# Patient Record
Sex: Female | Born: 1950 | Race: White | Hispanic: No | Smoking: Former smoker
Health system: Southern US, Community
[De-identification: ages and names within clinical notes are randomized; demographics above are authoritative.]

## PROBLEM LIST (undated history)

## (undated) DIAGNOSIS — M1990 Unspecified osteoarthritis, unspecified site: Secondary | ICD-10-CM

## (undated) DIAGNOSIS — J439 Emphysema, unspecified: Secondary | ICD-10-CM

## (undated) DIAGNOSIS — T7840XA Allergy, unspecified, initial encounter: Secondary | ICD-10-CM

## (undated) DIAGNOSIS — I509 Heart failure, unspecified: Secondary | ICD-10-CM

## (undated) DIAGNOSIS — E785 Hyperlipidemia, unspecified: Secondary | ICD-10-CM

## (undated) DIAGNOSIS — I10 Essential (primary) hypertension: Secondary | ICD-10-CM

## (undated) HISTORY — DX: Unspecified osteoarthritis, unspecified site: M19.90

## (undated) HISTORY — DX: Emphysema, unspecified: J43.9

## (undated) HISTORY — DX: Essential (primary) hypertension: I10

## (undated) HISTORY — DX: Hyperlipidemia, unspecified: E78.5

## (undated) HISTORY — DX: Allergy, unspecified, initial encounter: T78.40XA

## (undated) HISTORY — PX: BREAST SURGERY: SHX581

## (undated) HISTORY — PX: CORONARY ARTERY BYPASS GRAFT: SHX141

## (undated) HISTORY — DX: Heart failure, unspecified: I50.9

---

## 2013-08-21 DIAGNOSIS — I10 Essential (primary) hypertension: Secondary | ICD-10-CM | POA: Insufficient documentation

## 2015-01-04 DIAGNOSIS — Z951 Presence of aortocoronary bypass graft: Secondary | ICD-10-CM | POA: Insufficient documentation

## 2015-01-04 DIAGNOSIS — E7800 Pure hypercholesterolemia, unspecified: Secondary | ICD-10-CM | POA: Insufficient documentation

## 2015-05-27 LAB — HEPATIC FUNCTION PANEL
ALT: 36 U/L — AB (ref 7–35)
AST: 28 U/L (ref 13–35)

## 2015-05-27 LAB — LIPID PANEL
CHOLESTEROL: 155 mg/dL (ref 0–200)
HDL: 44 mg/dL (ref 35–70)
LDL CALC: 85 mg/dL
TRIGLYCERIDES: 132 mg/dL (ref 40–160)

## 2015-05-27 LAB — HEMOGLOBIN A1C: Hemoglobin A1C: 6.5

## 2015-06-10 ENCOUNTER — Encounter: Payer: Self-pay | Admitting: Sports Medicine

## 2015-06-10 ENCOUNTER — Ambulatory Visit (INDEPENDENT_AMBULATORY_CARE_PROVIDER_SITE_OTHER): Payer: Medicare Other | Admitting: Sports Medicine

## 2015-06-10 VITALS — BP 129/52 | HR 72 | Ht 59.0 in | Wt 156.0 lb

## 2015-06-10 DIAGNOSIS — Q828 Other specified congenital malformations of skin: Secondary | ICD-10-CM

## 2015-06-10 DIAGNOSIS — E785 Hyperlipidemia, unspecified: Secondary | ICD-10-CM | POA: Diagnosis not present

## 2015-06-10 DIAGNOSIS — Z0000 Encounter for general adult medical examination without abnormal findings: Secondary | ICD-10-CM | POA: Diagnosis not present

## 2015-06-10 DIAGNOSIS — E669 Obesity, unspecified: Secondary | ICD-10-CM | POA: Insufficient documentation

## 2015-06-10 DIAGNOSIS — I10 Essential (primary) hypertension: Secondary | ICD-10-CM

## 2015-06-10 DIAGNOSIS — I25810 Atherosclerosis of coronary artery bypass graft(s) without angina pectoris: Secondary | ICD-10-CM | POA: Diagnosis not present

## 2015-06-10 DIAGNOSIS — Z23 Encounter for immunization: Secondary | ICD-10-CM | POA: Diagnosis not present

## 2015-06-10 DIAGNOSIS — I2510 Atherosclerotic heart disease of native coronary artery without angina pectoris: Secondary | ICD-10-CM | POA: Insufficient documentation

## 2015-06-10 HISTORY — DX: Encounter for general adult medical examination without abnormal findings: Z00.00

## 2015-06-10 MED ORDER — TOPIRAMATE 50 MG PO TABS: ORAL_TABLET | ORAL | Status: DC

## 2015-06-10 MED ORDER — DULAGLUTIDE 1.5 MG/0.5ML ~~LOC~~ SOAJ: SUBCUTANEOUS | Status: DC

## 2015-06-10 MED ORDER — ASPIRIN EC 81 MG PO TBEC: 81.0000 mg | DELAYED_RELEASE_TABLET | Freq: Every day | ORAL | Status: DC

## 2015-06-10 NOTE — Assessment & Plan Note (Signed)
May return for skin tag removal.

## 2015-06-10 NOTE — Assessment & Plan Note (Signed)
Stable, no further episodes of angina. Continue atorvastatin, lisinopril, carvedilol, 81 mg of aspirin.

## 2015-06-10 NOTE — Assessment & Plan Note (Signed)
Continue atorvastatin

## 2015-06-10 NOTE — Assessment & Plan Note (Signed)
Continue lisinopril and carvedilol ?

## 2015-06-10 NOTE — Progress Notes (Signed)
  Subjective:    CC: Establish care.   HPI:  Hypertension: Well controlled on lisinopril  Coronary artery disease: Post quadruple bypass, currently on aspirin and carvedilol.  Hyperlipidemia: Stable on atorvastatin  Obesity: Desires to start weight loss treatment.  Past medical history, Surgical history, Family history not pertinant except as noted below, Social history, Allergies, and medications have been entered into the medical record, reviewed, and no changes needed.   Review of Systems: No headache, visual changes, nausea, vomiting, diarrhea, constipation, dizziness, abdominal pain, skin rash, fevers, chills, night sweats, swollen lymph nodes, weight loss, chest pain, body aches, joint swelling, muscle aches, shortness of breath, mood changes, visual or auditory hallucinations.  Objective:    General: Well Developed, well nourished, and in no acute distress.  Neuro: Alert and oriented x3, extra-ocular muscles intact, sensation grossly intact.  HEENT: Normocephalic, atraumatic, pupils equal round reactive to light, neck supple, no masses, no lymphadenopathy, thyroid nonpalpable.  Skin: Warm and dry, no rashes noted.  Cardiac: Regular rate and rhythm, no murmurs rubs or gallops.  Respiratory: Clear to auscultation bilaterally. Not using accessory muscles, speaking in full sentences.  Abdominal: Soft, nontender, nondistended, positive bowel sounds, no masses, no organomegaly.  Musculoskeletal: Shoulder, elbow, wrist, hip, knee, ankle stable, and with full range of motion.  Impression and Recommendations:    The patient was counselled, risk factors were discussed, anticipatory guidance given.

## 2015-06-10 NOTE — Assessment & Plan Note (Signed)
Declines influenza vaccination, pneumococcal 13, TDap. Recent blood work has already been done. She is also due for colonoscopy, cervical cancer screening, and mammogram.

## 2015-06-10 NOTE — Assessment & Plan Note (Signed)
With her history of coronary artery disease we do need to avoid phentermine, starting Trulicity, Topamax. Return monthly for weight checks,  I did inform her that if Trulicity wasn't covered we would  have to try to do a few more medications in the same class.

## 2015-06-17 ENCOUNTER — Ambulatory Visit (INDEPENDENT_AMBULATORY_CARE_PROVIDER_SITE_OTHER): Payer: Medicare Other | Admitting: Sports Medicine

## 2015-06-17 ENCOUNTER — Encounter: Payer: Self-pay | Admitting: Sports Medicine

## 2015-06-17 VITALS — BP 133/67 | HR 67 | Ht 59.0 in | Wt 157.0 lb

## 2015-06-17 DIAGNOSIS — I25810 Atherosclerosis of coronary artery bypass graft(s) without angina pectoris: Secondary | ICD-10-CM

## 2015-06-17 DIAGNOSIS — E669 Obesity, unspecified: Secondary | ICD-10-CM

## 2015-06-17 DIAGNOSIS — I10 Essential (primary) hypertension: Secondary | ICD-10-CM | POA: Diagnosis not present

## 2015-06-17 DIAGNOSIS — Z0000 Encounter for general adult medical examination without abnormal findings: Secondary | ICD-10-CM

## 2015-06-17 DIAGNOSIS — E119 Type 2 diabetes mellitus without complications: Secondary | ICD-10-CM

## 2015-06-17 DIAGNOSIS — M810 Age-related osteoporosis without current pathological fracture: Secondary | ICD-10-CM

## 2015-06-17 DIAGNOSIS — E1169 Type 2 diabetes mellitus with other specified complication: Secondary | ICD-10-CM | POA: Insufficient documentation

## 2015-06-17 MED ORDER — TOPIRAMATE 50 MG PO TABS: ORAL_TABLET | ORAL | Status: DC

## 2015-06-17 MED ORDER — CANAGLIFLOZIN-METFORMIN HCL 150-1000 MG PO TABS: 1.0000 | ORAL_TABLET | Freq: Two times a day (BID) | ORAL | Status: DC

## 2015-06-17 NOTE — Assessment & Plan Note (Signed)
Well controlled, no changes 

## 2015-06-17 NOTE — Assessment & Plan Note (Signed)
Starting in [facility]

## 2015-06-17 NOTE — Progress Notes (Addendum)
  Subjective:    Tracey Marshall is a 64 y.o. female who presents for a welcome to Medicare exam.   Hypertension: Stable and controlled.  Diabetes mellitus type 2: New diagnosis, hemoglobin A1c is 6.0.  Obesity: Unable to get any of the GLP-1 agonists approved. Has not yet started Topamax.  Cardiac risk factors: advanced age (older than 24 for men, 86 for women), diabetes mellitus, dyslipidemia, hypertension, obesity (BMI >= 30 kg/m2), sedentary lifestyle and smoking/ tobacco exposure.  Activities of Daily Living  In your present state of health, do you have any difficulty performing the following activities?:  Preparing food and eating?: No Bathing yourself: No Getting dressed: No Using the toilet:No Moving around from place to place: No In the past year have you fallen or had a near fall?:No  Current exercise habits: The patient does not participate in regular exercise at present.   Dietary issues discussed: Yes   Depression Screen (Note: if answer to either of the following is "Yes", then a more complete depression screening is indicated)  Q1: Over the past two weeks, have you felt down, depressed or hopeless?no Q2: Over the past two weeks, have you felt little interest or pleasure in doing things? no   The following portions of the patient's history were reviewed and updated as appropriate: allergies, current medications, past family history, past medical history, past social history, past surgical history and problem list. Review of Systems A comprehensive review of systems was negative.    Objective:     Vision by Snellen chart: right eye:20/25, left eye:20/20 Blood pressure 133/67, pulse 67, height 4\' 11"  (1.499 m), weight 157 lb (71.215 kg). Body mass index is 31.69 kg/(m^2). General: Well Developed, well nourished, and in no acute distress.  Neuro: Alert and oriented x3, extra-ocular muscles intact, sensation grossly intact. Cranial nerves II through XII are intact,  motor, sensory, and coordinative functions are all intact. HEENT: Normocephalic, atraumatic, pupils equal round reactive to light, neck supple, no masses, no lymphadenopathy, thyroid nonpalpable. Oropharynx, nasopharynx, external ear canals are unremarkable. Skin: Warm and dry, no rashes noted.  Cardiac: Regular rate and rhythm, no murmurs rubs or gallops.  Respiratory: Clear to auscultation bilaterally. Not using accessory muscles, speaking in full sentences.  Abdominal: Soft, nontender, nondistended, positive bowel sounds, no masses, no organomegaly.  Musculoskeletal: Shoulder, elbow, wrist, hip, knee, ankle stable, and with full range of motion.  12-lead ECG reviewed and shows normal sinus rhythm, rate of 66 bpm. Normal axis. No Q waves.  Assessment:   Healthy female, please see the assessment and plan for further details below.     Plan:     During the course of the visit the patient was educated and counseled about appropriate screening and preventive services including:   Pneumococcal vaccine   Influenza vaccine  Td vaccine  Screening electrocardiogram  Screening mammography  Bone densitometry screening  Colorectal cancer screening  Diabetes screening  Nutrition counseling   Patient Instructions (the written plan) was given to the patient.

## 2015-06-17 NOTE — Assessment & Plan Note (Signed)
None of the GLP-1 agonists are affordable, she will do Topamax, I can send thisthis to  [facility]

## 2015-06-17 NOTE — Assessment & Plan Note (Signed)
Complete physical as above. Blood work looks good, she is unfortunately a diabetic with a hemoglobin A1c of 6.5.

## 2015-06-21 ENCOUNTER — Telehealth: Payer: Self-pay | Admitting: Sports Medicine

## 2015-06-21 DIAGNOSIS — E669 Obesity, unspecified: Principal | ICD-10-CM

## 2015-06-21 DIAGNOSIS — E1169 Type 2 diabetes mellitus with other specified complication: Secondary | ICD-10-CM

## 2015-06-21 MED ORDER — CANAGLIFLOZIN 300 MG PO TABS: 300.0000 mg | ORAL_TABLET | Freq: Every day | ORAL | Status: DC

## 2015-06-21 MED ORDER — METFORMIN HCL 1000 MG PO TABS: 1000.0000 mg | ORAL_TABLET | Freq: Two times a day (BID) | ORAL | Status: DC

## 2015-06-21 NOTE — Telephone Encounter (Signed)
There is no generic for it, if she can afford it then do it, if not we will split them apart and do 2 separate medications.

## 2015-06-21 NOTE — Telephone Encounter (Signed)
Done and sent to mail order, if she still cant afford it then just have her do the metformin alone and not the invokana.

## 2015-06-21 NOTE — Telephone Encounter (Signed)
Pt called and stated [facility] does not have a generic for Invokamet. Will route to PCP to see what other alternatives there are.

## 2015-06-21 NOTE — Telephone Encounter (Signed)
Please send split Rx's to pharmacy. Thank you.

## 2015-06-22 NOTE — Telephone Encounter (Signed)
Left information on Pt's voicemail. Callback information provided for any questions.

## 2015-06-22 NOTE — Telephone Encounter (Signed)
Pt returned clinic call, went over information in detail. Verbalized understanding.

## 2015-06-23 ENCOUNTER — Other Ambulatory Visit: Payer: Self-pay | Admitting: Sports Medicine

## 2015-07-08 NOTE — Addendum Note (Signed)
Addended by: Huel Cote on: 07/08/2015 04:46 PM   Modules accepted: Orders

## 2015-08-17 ENCOUNTER — Ambulatory Visit (INDEPENDENT_AMBULATORY_CARE_PROVIDER_SITE_OTHER): Payer: Medicare Other | Admitting: Sports Medicine

## 2015-08-17 VITALS — BP 111/54 | HR 53 | Temp 97.9°F | Resp 18 | Wt 146.7 lb

## 2015-08-17 DIAGNOSIS — Z0000 Encounter for general adult medical examination without abnormal findings: Secondary | ICD-10-CM | POA: Diagnosis not present

## 2015-08-17 DIAGNOSIS — E119 Type 2 diabetes mellitus without complications: Secondary | ICD-10-CM

## 2015-08-17 DIAGNOSIS — E669 Obesity, unspecified: Secondary | ICD-10-CM

## 2015-08-17 DIAGNOSIS — E785 Hyperlipidemia, unspecified: Secondary | ICD-10-CM | POA: Diagnosis not present

## 2015-08-17 DIAGNOSIS — I10 Essential (primary) hypertension: Secondary | ICD-10-CM | POA: Diagnosis not present

## 2015-08-17 DIAGNOSIS — E1169 Type 2 diabetes mellitus with other specified complication: Secondary | ICD-10-CM

## 2015-08-17 LAB — COMPREHENSIVE METABOLIC PANEL WITH GFR
AST: 23 U/L (ref 10–35)
Albumin: 4.2 g/dL (ref 3.6–5.1)
BUN: 21 mg/dL (ref 7–25)
CO2: 20 mmol/L (ref 20–31)
Creat: 0.9 mg/dL (ref 0.50–0.99)
Sodium: 142 mmol/L (ref 135–146)
Total Bilirubin: 0.4 mg/dL (ref 0.2–1.2)
Total Protein: 6.7 g/dL (ref 6.1–8.1)

## 2015-08-17 LAB — POCT GLYCOSYLATED HEMOGLOBIN (HGB A1C): Hemoglobin A1C: 6.3

## 2015-08-17 LAB — CBC
HCT: 41.4 % (ref 36.0–46.0)
Hemoglobin: 13.8 g/dL (ref 12.0–15.0)
MCH: 30.6 pg (ref 26.0–34.0)
MCHC: 33.3 g/dL (ref 30.0–36.0)
MCV: 91.8 fL (ref 78.0–100.0)
MPV: 9.2 fL (ref 8.6–12.4)
Platelets: 329 K/uL (ref 150–400)
RBC: 4.51 MIL/uL (ref 3.87–5.11)
RDW: 14.1 % (ref 11.5–15.5)
WBC: 9.2 K/uL (ref 4.0–10.5)

## 2015-08-17 LAB — LIPID PANEL
Cholesterol: 138 mg/dL (ref 125–200)
HDL: 44 mg/dL — ABNORMAL LOW (ref >=46)
LDL Cholesterol: 76 mg/dL (ref <130)
Total CHOL/HDL Ratio: 3.1 Ratio (ref <=5.0)
Triglycerides: 91 mg/dL (ref <150)
VLDL: 18 mg/dL (ref <30)

## 2015-08-17 LAB — COMPREHENSIVE METABOLIC PANEL
ALT: 28 U/L (ref 6–29)
Alkaline Phosphatase: 67 U/L (ref 33–130)
Calcium: 9.8 mg/dL (ref 8.6–10.4)
Chloride: 109 mmol/L (ref 98–110)
Glucose, Bld: 100 mg/dL — ABNORMAL HIGH (ref 65–99)
Potassium: 4.4 mmol/L (ref 3.5–5.3)

## 2015-08-17 NOTE — Assessment & Plan Note (Signed)
Currently only taking metformin. Checking A1c.

## 2015-08-17 NOTE — Assessment & Plan Note (Signed)
Rechecking lipids. 

## 2015-08-17 NOTE — Assessment & Plan Note (Signed)
Well controlled, no changes 

## 2015-08-17 NOTE — Assessment & Plan Note (Signed)
Still needs colonoscopy, mammogram, bone density.

## 2015-08-17 NOTE — Progress Notes (Signed)
  Subjective:    CC: Follow-up  HPI: Hypertension: Well controlled  Hyperlipidemia: Well controlled  Diabetes mellitus type 2: Hemoglobin A1c is down to 6.3, well-controlled.  Preventive measures: Due for colonoscopy, mammogram, bone density, diabetic eye exam.  Past medical history, Surgical history, Family history not pertinant except as noted below, Social history, Allergies, and medications have been entered into the medical record, reviewed, and no changes needed.   Review of Systems: No fevers, chills, night sweats, weight loss, chest pain, or shortness of breath.   Objective:    General: Well Developed, well nourished, and in no acute distress.  Neuro: Alert and oriented x3, extra-ocular muscles intact, sensation grossly intact.  HEENT: Normocephalic, atraumatic, pupils equal round reactive to light, neck supple, no masses, no lymphadenopathy, thyroid nonpalpable.  Skin: Warm and dry, no rashes. Cardiac: Regular rate and rhythm, no murmurs rubs or gallops, no lower extremity edema.  Respiratory: Clear to auscultation bilaterally. Not using accessory muscles, speaking in full sentences. Diabetic Foot Exam Both feet were examined, there are no signs of ulceration or abnormal callus. Nails are unremarkable. Dorsalis pedis and posterior tibial pulses are palpable. Sensation is intact to sharp and monofilament. Shoes are of appropriate fitment.  Impression and Recommendations:

## 2015-08-18 ENCOUNTER — Other Ambulatory Visit: Payer: Self-pay

## 2015-08-18 MED ORDER — LISINOPRIL 40 MG PO TABS: 40.0000 mg | ORAL_TABLET | Freq: Every day | ORAL | Status: DC

## 2015-08-19 DIAGNOSIS — H2513 Age-related nuclear cataract, bilateral: Secondary | ICD-10-CM | POA: Diagnosis not present

## 2015-08-19 LAB — HM DIABETES EYE EXAM

## 2015-08-31 ENCOUNTER — Encounter: Payer: Self-pay | Admitting: Sports Medicine

## 2015-08-31 DIAGNOSIS — K635 Polyp of colon: Secondary | ICD-10-CM | POA: Diagnosis not present

## 2015-08-31 DIAGNOSIS — D124 Benign neoplasm of descending colon: Secondary | ICD-10-CM | POA: Diagnosis not present

## 2015-08-31 DIAGNOSIS — K621 Rectal polyp: Secondary | ICD-10-CM | POA: Diagnosis not present

## 2015-08-31 DIAGNOSIS — Z1211 Encounter for screening for malignant neoplasm of colon: Secondary | ICD-10-CM | POA: Diagnosis not present

## 2015-08-31 LAB — HM COLONOSCOPY

## 2015-09-01 ENCOUNTER — Encounter: Payer: Self-pay | Admitting: Sports Medicine

## 2015-09-17 ENCOUNTER — Encounter: Payer: Self-pay | Admitting: *Deleted

## 2015-09-17 ENCOUNTER — Emergency Department
Admission: EM | Admit: 2015-09-17 | Discharge: 2015-09-17 | Disposition: A | Discharge status: Home / Self Care | Payer: Medicare Other | Source: Home / Self Care | Attending: Family Medicine | Admitting: Family Medicine

## 2015-09-17 DIAGNOSIS — J111 Influenza due to unidentified influenza virus with other respiratory manifestations: Secondary | ICD-10-CM | POA: Diagnosis not present

## 2015-09-17 DIAGNOSIS — R69 Illness, unspecified: Principal | ICD-10-CM

## 2015-09-17 LAB — POCT RAPID STREP A (OFFICE): Rapid Strep A Screen: NEGATIVE

## 2015-09-17 MED ORDER — OSELTAMIVIR PHOSPHATE 75 MG PO CAPS
75.0000 mg | ORAL_CAPSULE | Freq: Two times a day (BID) | ORAL | Status: DC
Start: 2015-09-17 — End: 2015-12-16

## 2015-09-17 NOTE — Discharge Instructions (Signed)
Take plain guaifenesin (1200mg  extended release tabs such as Mucinex) twice daily, with plenty of water, for cough and congestion.  Get adequate rest.   May use Afrin nasal spray (or generic oxymetazoline) twice daily for about 5 days and then discontinue.  Also recommend using saline nasal spray several times daily and saline nasal irrigation (AYR is a common brand).   Try warm salt water gargles for sore throat.  Stop all antihistamines for now, and other non-prescription cough/cold preparations. May take Ibuprofen 200mg , 4 tabs every 8 hours with food for body aches, headache, etc.   Follow-up with family doctor if not improving about one week.   Influenza, Adult Influenza ("the flu") is a viral infection of the respiratory tract. It occurs more often in winter months because people spend more time in close contact with one another. Influenza can make you feel very sick. Influenza easily spreads from person to person (contagious). CAUSES  Influenza is caused by a virus that infects the respiratory tract. You can catch the virus by breathing in droplets from an infected person's cough or sneeze. You can also catch the virus by touching something that was recently contaminated with the virus and then touching your mouth, nose, or eyes. RISKS AND COMPLICATIONS You may be at risk for a more severe case of influenza if you smoke cigarettes, have diabetes, have chronic heart disease (such as heart failure) or lung disease (such as asthma), or if you have a weakened immune system. Elderly people and pregnant women are also at risk for more serious infections. The most common problem of influenza is a lung infection (pneumonia). Sometimes, this problem can require emergency medical care and may be life threatening. SIGNS AND SYMPTOMS  Symptoms typically last 4 to 10 days and may include:  Fever.  Chills.  Headache, body aches, and muscle aches.  Sore throat.  Chest discomfort and cough.  Poor  appetite.  Weakness or feeling tired.  Dizziness.  Nausea or vomiting. DIAGNOSIS  Diagnosis of influenza is often made based on your history and a physical exam. A nose or throat swab test can be done to confirm the diagnosis. TREATMENT  In mild cases, influenza goes away on its own. Treatment is directed at relieving symptoms. For more severe cases, your health care provider may prescribe antiviral medicines to shorten the sickness. Antibiotic medicines are not effective because the infection is caused by a virus, not by bacteria. HOME CARE INSTRUCTIONS  Take medicines only as directed by your health care provider.  Use a cool mist humidifier to make breathing easier.  Get plenty of rest until your temperature returns to normal. This usually takes 3 to 4 days.  Drink enough fluid to keep your urine clear or pale yellow.  Cover yourmouth and nosewhen coughing or sneezing,and wash your handswellto prevent thevirusfrom spreading.  Stay homefromwork orschool untilthe fever is gonefor at least 22full day. PREVENTION  An annual influenza vaccination (flu shot) is the best way to avoid getting influenza. An annual flu shot is now routinely recommended for all adults in the [place] IF:  You experiencechest pain, yourcough worsens,or you producemore mucus.  Youhave nausea,vomiting, ordiarrhea.  Your fever returns or gets worse. SEEK IMMEDIATE MEDICAL CARE IF:  You havetrouble breathing, you become short of breath,or your skin ornails becomebluish.  You have severe painor stiffnessin the neck.  You develop a sudden headache, or pain in the face or ear.  You have nausea or vomiting that you cannot  control. MAKE SURE YOU:   Understand these instructions.  Will watch your condition.  Will get help right away if you are not doing well or get worse.   This information is not intended to replace advice given to you by your health care  provider. Make sure you discuss any questions you have with your health care provider.   Document Released: 07/14/2000 Document Revised: 08/07/2014 Document Reviewed: 10/16/2011 Elsevier Interactive Patient Education [employer].

## 2015-09-17 NOTE — ED Provider Notes (Signed)
CSN: [id number]     Arrival date & time 09/17/15  1809 History   First MD Initiated Contact with Patient 09/17/15 1840     Chief Complaint  Patient presents with  . Chills  . Nasal Congestion      HPI Comments: Complains of 2 day history flu-like illness including myalgias, headache, chills, fatigue, and cough.  Also has mild nasal congestion and sore throat.  Cough is non-productive and somewhat worse at night.  No pleuritic pain or shortness of breath.  She has not had a flu shot this season.      The history is provided by the patient.    Past Medical History  Diagnosis Date  . Hyperlipidemia   . Hypertension    Past Surgical History  Procedure Laterality Date  . Cesarean section    . Coronary artery bypass graft     History reviewed. No pertinent family history. Social History  Substance Use Topics  . Smoking status: Former Research scientist (life sciences)  . Smokeless tobacco: None  . Alcohol Use: No   OB History    No data available     Review of Systems + sore throat + cough No pleuritic pain No wheezing + nasal congestion + post-nasal drainage No sinus pain/pressure No itchy/red eyes No earache No hemoptysis No SOB ? fever, + chills No nausea No vomiting No abdominal pain No diarrhea No urinary symptoms No skin rash + fatigue + myalgias + headache Used OTC meds without relief  Allergies  Influenza vaccines  Home Medications   Prior to Admission medications   Medication Sig Start Date End Date Taking? Authorizing Provider  aspirin EC 81 MG tablet Take 1 tablet (81 mg total) by mouth daily. 06/10/15   Silverio Decamp, MD  atorvastatin (LIPITOR) 80 MG tablet Take 80 mg by mouth daily.    Historical Provider, MD  carvedilol (COREG) 6.25 MG tablet Take 6.25 mg by mouth 2 (two) times daily with a meal.    Historical Provider, MD  lisinopril (PRINIVIL,ZESTRIL) 40 MG tablet Take 1 tablet (40 mg total) by mouth daily. 08/18/15   Silverio Decamp, MD  metFORMIN  (GLUCOPHAGE) 1000 MG tablet Take 1 tablet (1,000 mg total) by mouth 2 (two) times daily with a meal. 06/21/15   Silverio Decamp, MD  omeprazole (PRILOSEC) 20 MG capsule  07/28/15   Historical Provider, MD  oseltamivir (TAMIFLU) 75 MG capsule Take 1 capsule (75 mg total) by mouth every 12 (twelve) hours. 09/17/15   Kandra Nicolas, MD  topiramate (TOPAMAX) 50 MG tablet One half tab by mouth daily for a week, then one tab by mouth daily. 06/17/15   Silverio Decamp, MD   Meds Ordered and Administered this Visit  Medications - No data to display  BP 146/71 mmHg  Pulse 81  Temp(Src) 99.7 F (37.6 C) (Oral)  Resp 16  Wt 144 lb (65.318 kg)  SpO2 95% No data found.   Physical Exam Nursing notes and Vital Signs reviewed. Appearance:  Patient appears stated age, and in no acute distress Eyes:  Pupils are equal, round, and reactive to light and accomodation.  Extraocular movement is intact.  Conjunctivae are not inflamed  Ears:  Canals normal.  Tympanic membranes normal.  Nose:  Mildly congested turbinates.  No sinus tenderness.   Pharynx:  Uvula erythematous Neck:  Supple.  Tender enlarged posterior nodes are palpated bilaterally  Lungs:  Clear to auscultation.  Breath sounds are equal.  Moving air well. Heart:  Regular rate and rhythm without murmurs, rubs, or gallops.  Abdomen:  Nontender without masses or hepatosplenomegaly.  Bowel sounds are present.  No CVA or flank tenderness.  Extremities:  No edema.  Skin:  No rash present.   ED Course  Procedures  None    Labs Reviewed  POCT RAPID STREP A (OFFICE) negative     MDM   1. Influenza-like illness    Begin Tamiflu Take plain guaifenesin (1200mg  extended release tabs such as Mucinex) twice daily, with plenty of water, for cough and congestion.  Get adequate rest.   May use Afrin nasal spray (or generic oxymetazoline) twice daily for about 5 days and then discontinue.  Also recommend using saline nasal spray several  times daily and saline nasal irrigation (AYR is a common brand).   May take Delsym Cough Suppressant at bedtime for nighttime cough.  Try warm salt water gargles for sore throat.  Stop all antihistamines for now, and other non-prescription cough/cold preparations. May take Ibuprofen 200mg , 4 tabs every 8 hours with food for body aches, headache, etc.   Follow-up with family doctor if not improving about one week.   Kandra Nicolas, MD 09/17/15 [phone]

## 2015-09-17 NOTE — ED Notes (Signed)
Pt c/o chills, fever, congestion, sore throat and cough since yesterday. No flu vac this season.

## 2015-12-16 ENCOUNTER — Encounter: Payer: Self-pay | Admitting: Sports Medicine

## 2015-12-16 ENCOUNTER — Ambulatory Visit (INDEPENDENT_AMBULATORY_CARE_PROVIDER_SITE_OTHER): Payer: Medicare Other | Admitting: Sports Medicine

## 2015-12-16 VITALS — BP 106/66 | HR 69 | Resp 19 | Wt 144.7 lb

## 2015-12-16 DIAGNOSIS — E119 Type 2 diabetes mellitus without complications: Secondary | ICD-10-CM

## 2015-12-16 DIAGNOSIS — E1169 Type 2 diabetes mellitus with other specified complication: Secondary | ICD-10-CM

## 2015-12-16 DIAGNOSIS — E785 Hyperlipidemia, unspecified: Secondary | ICD-10-CM | POA: Diagnosis not present

## 2015-12-16 DIAGNOSIS — Z0000 Encounter for general adult medical examination without abnormal findings: Secondary | ICD-10-CM

## 2015-12-16 DIAGNOSIS — I10 Essential (primary) hypertension: Secondary | ICD-10-CM | POA: Diagnosis not present

## 2015-12-16 DIAGNOSIS — E669 Obesity, unspecified: Secondary | ICD-10-CM

## 2015-12-16 MED ORDER — NALTREXONE-BUPROPION HCL ER 8-90 MG PO TB12: ORAL_TABLET | ORAL | Status: DC

## 2015-12-16 NOTE — Assessment & Plan Note (Signed)
Stable and well-controlled, no changes needed.

## 2015-12-16 NOTE — Assessment & Plan Note (Signed)
Still needs mammogram and DEXA scan

## 2015-12-16 NOTE — Progress Notes (Signed)
  Subjective:    CC: Follow-up  HPI: Diabetes mellitus type 2: Stable, hemoglobin A1c is well controlled  Hypertension: Well controlled  Hyperlipidemia: Well controlled  Obesity: Desires start weight loss treatment  Preventive measures: Continues to be due for mammogram and bone density scan  Past medical history, Surgical history, Family history not pertinant except as noted below, Social history, Allergies, and medications have been entered into the medical record, reviewed, and no changes needed.   Review of Systems: No fevers, chills, night sweats, weight loss, chest pain, or shortness of breath.   Objective:    General: Well Developed, well nourished, and in no acute distress.  Neuro: Alert and oriented x3, extra-ocular muscles intact, sensation grossly intact.  HEENT: Normocephalic, atraumatic, pupils equal round reactive to light, neck supple, no masses, no lymphadenopathy, thyroid nonpalpable.  Skin: Warm and dry, no rashes. Cardiac: Regular rate and rhythm, no murmurs rubs or gallops, no lower extremity edema.  Respiratory: Clear to auscultation bilaterally. Not using accessory muscles, speaking in full sentences.  Impression and Recommendations:

## 2015-12-16 NOTE — Assessment & Plan Note (Signed)
Contrave, return in 1 month.

## 2016-01-12 ENCOUNTER — Telehealth: Payer: Self-pay

## 2016-01-12 ENCOUNTER — Ambulatory Visit (INDEPENDENT_AMBULATORY_CARE_PROVIDER_SITE_OTHER): Payer: Medicare Other

## 2016-01-12 DIAGNOSIS — R928 Other abnormal and inconclusive findings on diagnostic imaging of breast: Secondary | ICD-10-CM | POA: Diagnosis not present

## 2016-01-12 DIAGNOSIS — M810 Age-related osteoporosis without current pathological fracture: Secondary | ICD-10-CM | POA: Diagnosis not present

## 2016-01-12 DIAGNOSIS — Z780 Asymptomatic menopausal state: Secondary | ICD-10-CM | POA: Diagnosis not present

## 2016-01-12 DIAGNOSIS — Z1231 Encounter for screening mammogram for malignant neoplasm of breast: Secondary | ICD-10-CM

## 2016-01-12 DIAGNOSIS — M85851 Other specified disorders of bone density and structure, right thigh: Secondary | ICD-10-CM

## 2016-01-12 NOTE — Telephone Encounter (Signed)
Pt stated that you would like for her to start on fosomax.  I didn't see what dose you want her on. Please advise. (Send 30 day supply to [place] all other refills goes to [place])

## 2016-01-13 ENCOUNTER — Other Ambulatory Visit: Payer: Self-pay | Admitting: Sports Medicine

## 2016-01-13 ENCOUNTER — Other Ambulatory Visit: Payer: Self-pay | Admitting: *Deleted

## 2016-01-13 DIAGNOSIS — N6320 Unspecified lump in the left breast, unspecified quadrant: Secondary | ICD-10-CM | POA: Insufficient documentation

## 2016-01-13 DIAGNOSIS — M810 Age-related osteoporosis without current pathological fracture: Secondary | ICD-10-CM

## 2016-01-13 MED ORDER — ALENDRONATE SODIUM 70 MG PO TABS
70.0000 mg | ORAL_TABLET | ORAL | Status: DC
Start: 2016-01-13 — End: 2016-01-17

## 2016-01-13 MED ORDER — ALENDRONATE SODIUM 70 MG PO TABS: 70.0000 mg | ORAL_TABLET | ORAL | Status: DC

## 2016-01-13 NOTE — Assessment & Plan Note (Signed)
Starting Fosamax, recheck in 2 years.

## 2016-01-13 NOTE — Addendum Note (Signed)
Addended by: Silverio Decamp on: 01/13/2016 09:12 AM   Modules accepted: Orders

## 2016-01-13 NOTE — Telephone Encounter (Signed)
Already sent to [facility]

## 2016-01-14 ENCOUNTER — Other Ambulatory Visit: Payer: Self-pay | Admitting: Sports Medicine

## 2016-01-14 DIAGNOSIS — R928 Other abnormal and inconclusive findings on diagnostic imaging of breast: Secondary | ICD-10-CM

## 2016-01-17 ENCOUNTER — Other Ambulatory Visit: Payer: Self-pay | Admitting: Sports Medicine

## 2016-01-17 MED ORDER — IBANDRONATE SODIUM 150 MG PO TABS: 150.0000 mg | ORAL_TABLET | ORAL | Status: DC

## 2016-01-17 NOTE — Telephone Encounter (Signed)
Pt called clinic stating she cannot tolerated the Fosamax, it makes her very "sick." will route to PCP for alternative.

## 2016-01-17 NOTE — Addendum Note (Signed)
Addended by: Silverio Decamp on: 01/17/2016 04:49 PM   Modules accepted: Orders, Medications

## 2016-01-17 NOTE — Telephone Encounter (Signed)
Switching to oral Boniva, 150 mg monthly.

## 2016-01-17 NOTE — Telephone Encounter (Signed)
Pt advised of new Rx, verbalized understanding. No further questions/concerns.

## 2016-01-18 ENCOUNTER — Other Ambulatory Visit: Payer: Self-pay | Admitting: Sports Medicine

## 2016-01-18 DIAGNOSIS — R928 Other abnormal and inconclusive findings on diagnostic imaging of breast: Secondary | ICD-10-CM

## 2016-02-24 DIAGNOSIS — R928 Other abnormal and inconclusive findings on diagnostic imaging of breast: Secondary | ICD-10-CM | POA: Diagnosis not present

## 2016-03-02 DIAGNOSIS — Z170 Estrogen receptor positive status [ER+]: Secondary | ICD-10-CM | POA: Diagnosis not present

## 2016-03-02 DIAGNOSIS — C50412 Malignant neoplasm of upper-outer quadrant of left female breast: Secondary | ICD-10-CM | POA: Diagnosis not present

## 2016-03-02 DIAGNOSIS — N63 Unspecified lump in breast: Secondary | ICD-10-CM | POA: Diagnosis not present

## 2016-03-10 DIAGNOSIS — C50912 Malignant neoplasm of unspecified site of left female breast: Secondary | ICD-10-CM | POA: Diagnosis not present

## 2016-03-13 DIAGNOSIS — C50912 Malignant neoplasm of unspecified site of left female breast: Secondary | ICD-10-CM | POA: Insufficient documentation

## 2016-03-14 DIAGNOSIS — C50412 Malignant neoplasm of upper-outer quadrant of left female breast: Secondary | ICD-10-CM | POA: Diagnosis not present

## 2016-03-14 DIAGNOSIS — E785 Hyperlipidemia, unspecified: Secondary | ICD-10-CM | POA: Diagnosis not present

## 2016-03-14 DIAGNOSIS — I10 Essential (primary) hypertension: Secondary | ICD-10-CM | POA: Diagnosis not present

## 2016-03-14 DIAGNOSIS — Z87891 Personal history of nicotine dependence: Secondary | ICD-10-CM | POA: Diagnosis not present

## 2016-03-14 DIAGNOSIS — I2510 Atherosclerotic heart disease of native coronary artery without angina pectoris: Secondary | ICD-10-CM | POA: Diagnosis not present

## 2016-03-14 DIAGNOSIS — Z170 Estrogen receptor positive status [ER+]: Secondary | ICD-10-CM | POA: Diagnosis not present

## 2016-03-15 ENCOUNTER — Encounter: Payer: Self-pay | Admitting: Sports Medicine

## 2016-03-20 DIAGNOSIS — C50912 Malignant neoplasm of unspecified site of left female breast: Secondary | ICD-10-CM | POA: Diagnosis not present

## 2016-03-20 DIAGNOSIS — E785 Hyperlipidemia, unspecified: Secondary | ICD-10-CM | POA: Diagnosis not present

## 2016-03-20 DIAGNOSIS — Z87891 Personal history of nicotine dependence: Secondary | ICD-10-CM | POA: Diagnosis not present

## 2016-03-20 DIAGNOSIS — I10 Essential (primary) hypertension: Secondary | ICD-10-CM | POA: Diagnosis not present

## 2016-03-20 DIAGNOSIS — Z170 Estrogen receptor positive status [ER+]: Secondary | ICD-10-CM | POA: Diagnosis not present

## 2016-03-20 DIAGNOSIS — I2510 Atherosclerotic heart disease of native coronary artery without angina pectoris: Secondary | ICD-10-CM | POA: Diagnosis not present

## 2016-03-20 DIAGNOSIS — Z8042 Family history of malignant neoplasm of prostate: Secondary | ICD-10-CM | POA: Diagnosis not present

## 2016-03-22 DIAGNOSIS — Z87891 Personal history of nicotine dependence: Secondary | ICD-10-CM | POA: Diagnosis not present

## 2016-03-22 DIAGNOSIS — C50912 Malignant neoplasm of unspecified site of left female breast: Secondary | ICD-10-CM | POA: Diagnosis not present

## 2016-03-22 DIAGNOSIS — C50412 Malignant neoplasm of upper-outer quadrant of left female breast: Secondary | ICD-10-CM | POA: Diagnosis not present

## 2016-03-22 DIAGNOSIS — K219 Gastro-esophageal reflux disease without esophagitis: Secondary | ICD-10-CM | POA: Diagnosis not present

## 2016-03-22 DIAGNOSIS — E785 Hyperlipidemia, unspecified: Secondary | ICD-10-CM | POA: Diagnosis not present

## 2016-03-22 DIAGNOSIS — Z170 Estrogen receptor positive status [ER+]: Secondary | ICD-10-CM | POA: Diagnosis not present

## 2016-03-22 DIAGNOSIS — Z887 Allergy status to serum and vaccine status: Secondary | ICD-10-CM | POA: Diagnosis not present

## 2016-03-22 DIAGNOSIS — I25810 Atherosclerosis of coronary artery bypass graft(s) without angina pectoris: Secondary | ICD-10-CM | POA: Diagnosis not present

## 2016-03-22 DIAGNOSIS — I10 Essential (primary) hypertension: Secondary | ICD-10-CM | POA: Diagnosis not present

## 2016-03-30 DIAGNOSIS — C50912 Malignant neoplasm of unspecified site of left female breast: Secondary | ICD-10-CM | POA: Diagnosis not present

## 2016-03-30 DIAGNOSIS — Z9012 Acquired absence of left breast and nipple: Secondary | ICD-10-CM | POA: Diagnosis not present

## 2016-03-30 DIAGNOSIS — Z87891 Personal history of nicotine dependence: Secondary | ICD-10-CM | POA: Diagnosis not present

## 2016-03-30 DIAGNOSIS — Z170 Estrogen receptor positive status [ER+]: Secondary | ICD-10-CM | POA: Diagnosis not present

## 2016-03-30 DIAGNOSIS — E785 Hyperlipidemia, unspecified: Secondary | ICD-10-CM | POA: Diagnosis not present

## 2016-03-30 DIAGNOSIS — I10 Essential (primary) hypertension: Secondary | ICD-10-CM | POA: Diagnosis not present

## 2016-03-30 DIAGNOSIS — S2000XA Contusion of breast, unspecified breast, initial encounter: Secondary | ICD-10-CM | POA: Insufficient documentation

## 2016-03-30 DIAGNOSIS — I2510 Atherosclerotic heart disease of native coronary artery without angina pectoris: Secondary | ICD-10-CM | POA: Diagnosis not present

## 2016-04-11 DIAGNOSIS — I2510 Atherosclerotic heart disease of native coronary artery without angina pectoris: Secondary | ICD-10-CM | POA: Diagnosis not present

## 2016-04-11 DIAGNOSIS — K219 Gastro-esophageal reflux disease without esophagitis: Secondary | ICD-10-CM | POA: Diagnosis not present

## 2016-04-11 DIAGNOSIS — C50912 Malignant neoplasm of unspecified site of left female breast: Secondary | ICD-10-CM | POA: Diagnosis not present

## 2016-04-11 DIAGNOSIS — Z87891 Personal history of nicotine dependence: Secondary | ICD-10-CM | POA: Diagnosis not present

## 2016-04-11 DIAGNOSIS — Z170 Estrogen receptor positive status [ER+]: Secondary | ICD-10-CM | POA: Diagnosis not present

## 2016-04-11 DIAGNOSIS — C50412 Malignant neoplasm of upper-outer quadrant of left female breast: Secondary | ICD-10-CM | POA: Diagnosis not present

## 2016-04-11 DIAGNOSIS — I10 Essential (primary) hypertension: Secondary | ICD-10-CM | POA: Diagnosis not present

## 2016-04-11 DIAGNOSIS — E785 Hyperlipidemia, unspecified: Secondary | ICD-10-CM | POA: Diagnosis not present

## 2016-04-11 DIAGNOSIS — Z9889 Other specified postprocedural states: Secondary | ICD-10-CM | POA: Diagnosis not present

## 2016-04-12 ENCOUNTER — Other Ambulatory Visit: Payer: Self-pay | Admitting: Sports Medicine

## 2016-04-12 DIAGNOSIS — E1169 Type 2 diabetes mellitus with other specified complication: Secondary | ICD-10-CM

## 2016-04-12 DIAGNOSIS — E669 Obesity, unspecified: Secondary | ICD-10-CM

## 2016-04-14 DIAGNOSIS — C50412 Malignant neoplasm of upper-outer quadrant of left female breast: Secondary | ICD-10-CM | POA: Diagnosis not present

## 2016-04-17 DIAGNOSIS — Z510 Encounter for antineoplastic radiation therapy: Secondary | ICD-10-CM | POA: Diagnosis not present

## 2016-04-17 DIAGNOSIS — C50412 Malignant neoplasm of upper-outer quadrant of left female breast: Secondary | ICD-10-CM | POA: Diagnosis not present

## 2016-04-17 DIAGNOSIS — Z170 Estrogen receptor positive status [ER+]: Secondary | ICD-10-CM | POA: Diagnosis not present

## 2016-04-25 DIAGNOSIS — C50412 Malignant neoplasm of upper-outer quadrant of left female breast: Secondary | ICD-10-CM | POA: Diagnosis not present

## 2016-04-25 DIAGNOSIS — Z510 Encounter for antineoplastic radiation therapy: Secondary | ICD-10-CM | POA: Diagnosis not present

## 2016-04-25 DIAGNOSIS — Z170 Estrogen receptor positive status [ER+]: Secondary | ICD-10-CM | POA: Diagnosis not present

## 2016-04-26 DIAGNOSIS — C50412 Malignant neoplasm of upper-outer quadrant of left female breast: Secondary | ICD-10-CM | POA: Diagnosis not present

## 2016-04-26 DIAGNOSIS — Z510 Encounter for antineoplastic radiation therapy: Secondary | ICD-10-CM | POA: Diagnosis not present

## 2016-04-26 DIAGNOSIS — Z170 Estrogen receptor positive status [ER+]: Secondary | ICD-10-CM | POA: Diagnosis not present

## 2016-04-27 DIAGNOSIS — C50412 Malignant neoplasm of upper-outer quadrant of left female breast: Secondary | ICD-10-CM | POA: Diagnosis not present

## 2016-04-27 DIAGNOSIS — Z510 Encounter for antineoplastic radiation therapy: Secondary | ICD-10-CM | POA: Diagnosis not present

## 2016-04-27 DIAGNOSIS — Z170 Estrogen receptor positive status [ER+]: Secondary | ICD-10-CM | POA: Diagnosis not present

## 2016-04-28 DIAGNOSIS — C50412 Malignant neoplasm of upper-outer quadrant of left female breast: Secondary | ICD-10-CM | POA: Diagnosis not present

## 2016-04-28 DIAGNOSIS — Z170 Estrogen receptor positive status [ER+]: Secondary | ICD-10-CM | POA: Diagnosis not present

## 2016-04-28 DIAGNOSIS — Z510 Encounter for antineoplastic radiation therapy: Secondary | ICD-10-CM | POA: Diagnosis not present

## 2016-05-01 DIAGNOSIS — Z510 Encounter for antineoplastic radiation therapy: Secondary | ICD-10-CM | POA: Diagnosis not present

## 2016-05-01 DIAGNOSIS — C50912 Malignant neoplasm of unspecified site of left female breast: Secondary | ICD-10-CM | POA: Diagnosis not present

## 2016-05-01 DIAGNOSIS — Z170 Estrogen receptor positive status [ER+]: Secondary | ICD-10-CM | POA: Diagnosis not present

## 2016-05-02 DIAGNOSIS — Z170 Estrogen receptor positive status [ER+]: Secondary | ICD-10-CM | POA: Diagnosis not present

## 2016-05-02 DIAGNOSIS — C50912 Malignant neoplasm of unspecified site of left female breast: Secondary | ICD-10-CM | POA: Diagnosis not present

## 2016-05-02 DIAGNOSIS — Z510 Encounter for antineoplastic radiation therapy: Secondary | ICD-10-CM | POA: Diagnosis not present

## 2016-05-03 DIAGNOSIS — Z170 Estrogen receptor positive status [ER+]: Secondary | ICD-10-CM | POA: Diagnosis not present

## 2016-05-03 DIAGNOSIS — C50912 Malignant neoplasm of unspecified site of left female breast: Secondary | ICD-10-CM | POA: Diagnosis not present

## 2016-05-03 DIAGNOSIS — Z510 Encounter for antineoplastic radiation therapy: Secondary | ICD-10-CM | POA: Diagnosis not present

## 2016-05-04 DIAGNOSIS — C50912 Malignant neoplasm of unspecified site of left female breast: Secondary | ICD-10-CM | POA: Diagnosis not present

## 2016-05-04 DIAGNOSIS — Z510 Encounter for antineoplastic radiation therapy: Secondary | ICD-10-CM | POA: Diagnosis not present

## 2016-05-04 DIAGNOSIS — Z170 Estrogen receptor positive status [ER+]: Secondary | ICD-10-CM | POA: Diagnosis not present

## 2016-05-04 DIAGNOSIS — C50412 Malignant neoplasm of upper-outer quadrant of left female breast: Secondary | ICD-10-CM | POA: Diagnosis not present

## 2016-05-05 DIAGNOSIS — Z170 Estrogen receptor positive status [ER+]: Secondary | ICD-10-CM | POA: Diagnosis not present

## 2016-05-05 DIAGNOSIS — C50912 Malignant neoplasm of unspecified site of left female breast: Secondary | ICD-10-CM | POA: Diagnosis not present

## 2016-05-05 DIAGNOSIS — Z510 Encounter for antineoplastic radiation therapy: Secondary | ICD-10-CM | POA: Diagnosis not present

## 2016-05-08 DIAGNOSIS — Z170 Estrogen receptor positive status [ER+]: Secondary | ICD-10-CM | POA: Diagnosis not present

## 2016-05-08 DIAGNOSIS — Z510 Encounter for antineoplastic radiation therapy: Secondary | ICD-10-CM | POA: Diagnosis not present

## 2016-05-08 DIAGNOSIS — C50912 Malignant neoplasm of unspecified site of left female breast: Secondary | ICD-10-CM | POA: Diagnosis not present

## 2016-05-09 DIAGNOSIS — Z510 Encounter for antineoplastic radiation therapy: Secondary | ICD-10-CM | POA: Diagnosis not present

## 2016-05-09 DIAGNOSIS — Z170 Estrogen receptor positive status [ER+]: Secondary | ICD-10-CM | POA: Diagnosis not present

## 2016-05-09 DIAGNOSIS — C50912 Malignant neoplasm of unspecified site of left female breast: Secondary | ICD-10-CM | POA: Diagnosis not present

## 2016-05-10 DIAGNOSIS — Z170 Estrogen receptor positive status [ER+]: Secondary | ICD-10-CM | POA: Diagnosis not present

## 2016-05-10 DIAGNOSIS — C50912 Malignant neoplasm of unspecified site of left female breast: Secondary | ICD-10-CM | POA: Diagnosis not present

## 2016-05-10 DIAGNOSIS — Z510 Encounter for antineoplastic radiation therapy: Secondary | ICD-10-CM | POA: Diagnosis not present

## 2016-05-11 DIAGNOSIS — C50412 Malignant neoplasm of upper-outer quadrant of left female breast: Secondary | ICD-10-CM | POA: Diagnosis not present

## 2016-05-11 DIAGNOSIS — Z170 Estrogen receptor positive status [ER+]: Secondary | ICD-10-CM | POA: Diagnosis not present

## 2016-05-11 DIAGNOSIS — Z510 Encounter for antineoplastic radiation therapy: Secondary | ICD-10-CM | POA: Diagnosis not present

## 2016-05-11 DIAGNOSIS — C50912 Malignant neoplasm of unspecified site of left female breast: Secondary | ICD-10-CM | POA: Diagnosis not present

## 2016-05-12 DIAGNOSIS — Z510 Encounter for antineoplastic radiation therapy: Secondary | ICD-10-CM | POA: Diagnosis not present

## 2016-05-12 DIAGNOSIS — C50912 Malignant neoplasm of unspecified site of left female breast: Secondary | ICD-10-CM | POA: Diagnosis not present

## 2016-05-12 DIAGNOSIS — Z170 Estrogen receptor positive status [ER+]: Secondary | ICD-10-CM | POA: Diagnosis not present

## 2016-05-15 DIAGNOSIS — Z170 Estrogen receptor positive status [ER+]: Secondary | ICD-10-CM | POA: Diagnosis not present

## 2016-05-15 DIAGNOSIS — C50912 Malignant neoplasm of unspecified site of left female breast: Secondary | ICD-10-CM | POA: Diagnosis not present

## 2016-05-15 DIAGNOSIS — Z510 Encounter for antineoplastic radiation therapy: Secondary | ICD-10-CM | POA: Diagnosis not present

## 2016-05-16 DIAGNOSIS — C50912 Malignant neoplasm of unspecified site of left female breast: Secondary | ICD-10-CM | POA: Diagnosis not present

## 2016-05-16 DIAGNOSIS — Z510 Encounter for antineoplastic radiation therapy: Secondary | ICD-10-CM | POA: Diagnosis not present

## 2016-05-16 DIAGNOSIS — Z170 Estrogen receptor positive status [ER+]: Secondary | ICD-10-CM | POA: Diagnosis not present

## 2016-05-17 DIAGNOSIS — C50912 Malignant neoplasm of unspecified site of left female breast: Secondary | ICD-10-CM | POA: Diagnosis not present

## 2016-05-17 DIAGNOSIS — Z510 Encounter for antineoplastic radiation therapy: Secondary | ICD-10-CM | POA: Diagnosis not present

## 2016-05-17 DIAGNOSIS — Z170 Estrogen receptor positive status [ER+]: Secondary | ICD-10-CM | POA: Diagnosis not present

## 2016-05-18 DIAGNOSIS — Z170 Estrogen receptor positive status [ER+]: Secondary | ICD-10-CM | POA: Diagnosis not present

## 2016-05-18 DIAGNOSIS — Z510 Encounter for antineoplastic radiation therapy: Secondary | ICD-10-CM | POA: Diagnosis not present

## 2016-05-18 DIAGNOSIS — C50912 Malignant neoplasm of unspecified site of left female breast: Secondary | ICD-10-CM | POA: Diagnosis not present

## 2016-05-19 DIAGNOSIS — Z951 Presence of aortocoronary bypass graft: Secondary | ICD-10-CM | POA: Diagnosis not present

## 2016-05-19 DIAGNOSIS — I2510 Atherosclerotic heart disease of native coronary artery without angina pectoris: Secondary | ICD-10-CM | POA: Diagnosis not present

## 2016-05-19 DIAGNOSIS — E7800 Pure hypercholesterolemia, unspecified: Secondary | ICD-10-CM | POA: Diagnosis not present

## 2016-05-19 DIAGNOSIS — I10 Essential (primary) hypertension: Secondary | ICD-10-CM | POA: Diagnosis not present

## 2016-05-25 DIAGNOSIS — I2510 Atherosclerotic heart disease of native coronary artery without angina pectoris: Secondary | ICD-10-CM | POA: Diagnosis not present

## 2016-05-25 DIAGNOSIS — Z79811 Long term (current) use of aromatase inhibitors: Secondary | ICD-10-CM | POA: Diagnosis not present

## 2016-05-25 DIAGNOSIS — E860 Dehydration: Secondary | ICD-10-CM | POA: Diagnosis not present

## 2016-05-25 DIAGNOSIS — Z5181 Encounter for therapeutic drug level monitoring: Secondary | ICD-10-CM | POA: Diagnosis not present

## 2016-05-25 DIAGNOSIS — Z79899 Other long term (current) drug therapy: Secondary | ICD-10-CM | POA: Diagnosis not present

## 2016-05-25 DIAGNOSIS — Z170 Estrogen receptor positive status [ER+]: Secondary | ICD-10-CM | POA: Diagnosis not present

## 2016-05-25 DIAGNOSIS — T300 Burn of unspecified body region, unspecified degree: Secondary | ICD-10-CM | POA: Insufficient documentation

## 2016-05-25 DIAGNOSIS — C50012 Malignant neoplasm of nipple and areola, left female breast: Secondary | ICD-10-CM | POA: Diagnosis not present

## 2016-05-30 DIAGNOSIS — Z170 Estrogen receptor positive status [ER+]: Secondary | ICD-10-CM | POA: Diagnosis not present

## 2016-05-30 DIAGNOSIS — C50012 Malignant neoplasm of nipple and areola, left female breast: Secondary | ICD-10-CM | POA: Diagnosis not present

## 2016-05-30 DIAGNOSIS — T300 Burn of unspecified body region, unspecified degree: Secondary | ICD-10-CM | POA: Diagnosis not present

## 2016-05-30 DIAGNOSIS — I2510 Atherosclerotic heart disease of native coronary artery without angina pectoris: Secondary | ICD-10-CM | POA: Diagnosis not present

## 2016-05-30 DIAGNOSIS — Z79811 Long term (current) use of aromatase inhibitors: Secondary | ICD-10-CM | POA: Diagnosis not present

## 2016-05-30 DIAGNOSIS — Z79899 Other long term (current) drug therapy: Secondary | ICD-10-CM | POA: Diagnosis not present

## 2016-05-30 DIAGNOSIS — E860 Dehydration: Secondary | ICD-10-CM | POA: Diagnosis not present

## 2016-06-08 ENCOUNTER — Telehealth: Payer: Self-pay

## 2016-06-08 MED ORDER — FLUOCINONIDE-E 0.05 % EX CREA: 1.0000 "application " | TOPICAL_CREAM | Freq: Two times a day (BID) | CUTANEOUS | 3 refills | Status: DC

## 2016-06-08 NOTE — Telephone Encounter (Signed)
Done

## 2016-06-08 NOTE — Telephone Encounter (Signed)
Pt is asking could you Rx her some fluocinonide 0.05 % 60 mL bottle. This is something that was Rx by her dermatologist 2 years ago. Please advise.

## 2016-06-09 ENCOUNTER — Other Ambulatory Visit: Payer: Self-pay

## 2016-06-09 NOTE — Telephone Encounter (Signed)
Pt.notified

## 2016-06-12 ENCOUNTER — Other Ambulatory Visit: Payer: Self-pay | Admitting: Sports Medicine

## 2016-06-12 MED ORDER — CARVEDILOL 6.25 MG PO TABS: 6.2500 mg | ORAL_TABLET | Freq: Two times a day (BID) | ORAL | 1 refills | Status: DC

## 2016-06-12 MED ORDER — ATORVASTATIN CALCIUM 80 MG PO TABS: 80.0000 mg | ORAL_TABLET | Freq: Every day | ORAL | 1 refills | Status: DC

## 2016-06-12 MED ORDER — OMEPRAZOLE 20 MG PO CPDR: 20.0000 mg | DELAYED_RELEASE_CAPSULE | Freq: Every day | ORAL | 1 refills | Status: DC

## 2016-06-13 ENCOUNTER — Encounter: Payer: Self-pay | Admitting: Sports Medicine

## 2016-06-13 DIAGNOSIS — I10 Essential (primary) hypertension: Secondary | ICD-10-CM | POA: Diagnosis not present

## 2016-06-13 DIAGNOSIS — I2510 Atherosclerotic heart disease of native coronary artery without angina pectoris: Secondary | ICD-10-CM | POA: Diagnosis not present

## 2016-06-13 DIAGNOSIS — Z951 Presence of aortocoronary bypass graft: Secondary | ICD-10-CM | POA: Diagnosis not present

## 2016-06-13 DIAGNOSIS — E7800 Pure hypercholesterolemia, unspecified: Secondary | ICD-10-CM | POA: Diagnosis not present

## 2016-10-03 DIAGNOSIS — C50012 Malignant neoplasm of nipple and areola, left female breast: Secondary | ICD-10-CM | POA: Diagnosis not present

## 2016-10-03 DIAGNOSIS — Z170 Estrogen receptor positive status [ER+]: Secondary | ICD-10-CM | POA: Diagnosis not present

## 2016-10-03 DIAGNOSIS — Z79811 Long term (current) use of aromatase inhibitors: Secondary | ICD-10-CM | POA: Diagnosis not present

## 2016-10-03 DIAGNOSIS — C50912 Malignant neoplasm of unspecified site of left female breast: Secondary | ICD-10-CM | POA: Diagnosis not present

## 2016-10-03 DIAGNOSIS — Z87891 Personal history of nicotine dependence: Secondary | ICD-10-CM | POA: Diagnosis not present

## 2016-10-03 DIAGNOSIS — F17210 Nicotine dependence, cigarettes, uncomplicated: Secondary | ICD-10-CM | POA: Insufficient documentation

## 2016-10-03 DIAGNOSIS — Z5181 Encounter for therapeutic drug level monitoring: Secondary | ICD-10-CM | POA: Diagnosis not present

## 2016-11-06 ENCOUNTER — Other Ambulatory Visit: Payer: Self-pay | Admitting: Sports Medicine

## 2016-11-06 DIAGNOSIS — M810 Age-related osteoporosis without current pathological fracture: Secondary | ICD-10-CM

## 2016-11-10 ENCOUNTER — Other Ambulatory Visit: Payer: Self-pay | Admitting: Sports Medicine

## 2016-11-23 DIAGNOSIS — E119 Type 2 diabetes mellitus without complications: Secondary | ICD-10-CM | POA: Diagnosis not present

## 2016-11-23 LAB — HM DIABETES EYE EXAM

## 2016-12-12 ENCOUNTER — Ambulatory Visit (INDEPENDENT_AMBULATORY_CARE_PROVIDER_SITE_OTHER): Payer: Medicare Other | Admitting: Sports Medicine

## 2016-12-12 ENCOUNTER — Encounter: Payer: Self-pay | Admitting: Sports Medicine

## 2016-12-12 VITALS — BP 110/64 | HR 62 | Resp 16 | Ht <= 58 in | Wt 144.1 lb

## 2016-12-12 DIAGNOSIS — E669 Obesity, unspecified: Secondary | ICD-10-CM

## 2016-12-12 DIAGNOSIS — Z23 Encounter for immunization: Secondary | ICD-10-CM | POA: Diagnosis not present

## 2016-12-12 DIAGNOSIS — E1169 Type 2 diabetes mellitus with other specified complication: Secondary | ICD-10-CM

## 2016-12-12 DIAGNOSIS — I10 Essential (primary) hypertension: Secondary | ICD-10-CM

## 2016-12-12 DIAGNOSIS — Z0000 Encounter for general adult medical examination without abnormal findings: Secondary | ICD-10-CM

## 2016-12-12 NOTE — Assessment & Plan Note (Signed)
Has historically been well-controlled, rechecking blood work.  Foot exam today was normal, up-to-date on other screening measures.

## 2016-12-12 NOTE — Progress Notes (Signed)
Subjective:   Tracey Marshall is a 66 y.o. female who presents for Medicare Annual (Subsequent) preventive examination.  Review of Systems:  A comprehensive review of systems was negative except as noted below       Objective:     Vitals: BP 110/64   Pulse 62   Resp 16   Ht 4' 9.5" (1.461 m)   Wt 144 lb 1.6 oz (65.4 kg)   BMI 30.64 kg/m   Body mass index is 30.64 kg/m.   Tobacco History  Smoking Status  . Former Smoker  Smokeless Tobacco  . Never Used     Counseling given: Not Answered   Past Medical History:  Diagnosis Date  . Hyperlipidemia   . Hypertension    Past Surgical History:  Procedure Laterality Date  . CESAREAN SECTION    . CORONARY ARTERY BYPASS GRAFT     No family history on file. History  Sexual Activity  . Sexual activity: No    Outpatient Encounter Prescriptions as of 12/12/2016  Medication Sig  . aspirin EC 81 MG tablet Take 1 tablet (81 mg total) by mouth daily.  Marland Kitchen atorvastatin (LIPITOR) 80 MG tablet TAKE 1 TABLET BY MOUTH  DAILY  . carvedilol (COREG) 6.25 MG tablet TAKE 1 TABLET BY MOUTH TWO  TIMES DAILY WITH A MEAL  . fluocinonide-emollient (LIDEX-E) 0.05 % cream Apply 1 application topically 2 (two) times daily.  Marland Kitchen lisinopril (PRINIVIL,ZESTRIL) 40 MG tablet Take 1 tablet (40 mg total) by mouth daily.  . metFORMIN (GLUCOPHAGE) 1000 MG tablet Take 1 tablet by mouth two  times daily with meals  . Naltrexone-Bupropion HCl ER 8-90 MG TB12 1 tab daily for week 1, then 1 tab BID for week 2, then 2 tab PO qAM and 1 tab PO qPM for week 3, then 2 tabs BID.  Marland Kitchen omeprazole (PRILOSEC) 20 MG capsule TAKE 1 CAPSULE BY MOUTH  DAILY  . topiramate (TOPAMAX) 50 MG tablet Take one-half tablet by  mouth daily for a week,  then one tab by mouth  daily.  . [DISCONTINUED] alendronate (FOSAMAX) 70 MG tablet TAKE 1 TABLET BY MOUTH  EVERY 7 DAYS  . [DISCONTINUED] ibandronate (BONIVA) 150 MG tablet Take 1 tablet (150 mg total) by mouth every 30 (thirty) days. Take  in the morning with a full glass of water, on an empty stomach, and do not take anything else by mouth or lie down for the next 30 min.   No facility-administered encounter medications on file as of 12/12/2016.    General: Well Developed, well nourished, and in no acute distress.  Neuro: Alert and oriented x3, extra-ocular muscles intact, sensation grossly intact. Cranial nerves II through XII are intact, motor, sensory, and coordinative functions are all intact. HEENT: Normocephalic, atraumatic, pupils equal round reactive to light, neck supple, no masses, no lymphadenopathy, thyroid nonpalpable. Oropharynx, nasopharynx, external ear canals are unremarkable. Skin: Warm and dry, no rashes noted.  Cardiac: Regular rate and rhythm, no murmurs rubs or gallops.  Respiratory: Clear to auscultation bilaterally. Not using accessory muscles, speaking in full sentences.  Abdominal: Soft, nontender, nondistended, positive bowel sounds, no masses, no organomegaly.  Musculoskeletal: Shoulder, elbow, wrist, hip, knee, ankle stable, and with full range of motion.   Activities of Daily Living No difficulty with activities of daily living and instrumental activities of daily living Patient Care Team: Silverio Decamp, MD as PCP - General (Family Medicine)    Assessment:    Healthy female Exercise Activities  and Dietary recommendations    Goals    None     Fall Risk No flowsheet data found. Depression Screen PHQ 2/9 Scores 12/12/2016  PHQ - 2 Score 0  PHQ- 9 Score 0     Cognitive Function Normal Mini-Mental Status exam  Immunization History  Administered Date(s) Administered  . PPD Test 10/31/2016  . Pneumococcal Conjugate-13 06/10/2015  . Pneumococcal Polysaccharide-23 12/12/2016  . Tdap 06/10/2015   Screening Tests Health Maintenance  Topic Date Due  . Hepatitis C Screening  July 11, 1951  . HEMOGLOBIN A1C  02/14/2016  . INFLUENZA VACCINE  02/28/2017  . OPHTHALMOLOGY EXAM   11/28/2017  . FOOT EXAM  12/12/2017  . MAMMOGRAM  01/11/2018  . TETANUS/TDAP  06/09/2025  . COLONOSCOPY  08/30/2025  . DEXA SCAN  Completed  . PNA vac Low Risk Adult  Completed      Plan:   see below in problem list for plan  I have personally reviewed and noted the following in the patient's chart:   . Medical and social history . Use of alcohol, tobacco or illicit drugs  . Current medications and supplements . Functional ability and status . Nutritional status . Physical activity . Advanced directives . List of other physicians . Hospitalizations, surgeries, and ER visits in previous 12 months . Vitals . Screenings to include cognitive, depression, and falls . Referrals and appointments  In addition, I have reviewed and discussed with patient certain preventive protocols, quality metrics, and best practice recommendations. A written personalized care plan for preventive services as well as general preventive health recommendations were provided to patient.     Aundria Mems, MD  12/12/2016

## 2016-12-12 NOTE — Assessment & Plan Note (Signed)
Well controlled, no changes 

## 2016-12-12 NOTE — Assessment & Plan Note (Addendum)
Medicare physical as above. Checking routine blood work. She did have a pneumococcal 13 after the age of 15, she needs a 16 today. She is off of her osteoporosis medicine due to her current breast cancer hormonal therapy.

## 2016-12-13 LAB — COMPREHENSIVE METABOLIC PANEL WITH GFR
ALT: 19 U/L (ref 6–29)
AST: 16 U/L (ref 10–35)
Alkaline Phosphatase: 64 U/L (ref 33–130)
CO2: 19 mmol/L — ABNORMAL LOW (ref 20–31)
Calcium: 9.4 mg/dL (ref 8.6–10.4)
Creat: 0.97 mg/dL (ref 0.50–0.99)
Glucose, Bld: 119 mg/dL — ABNORMAL HIGH (ref 65–99)
Sodium: 143 mmol/L (ref 135–146)
Total Bilirubin: 0.5 mg/dL (ref 0.2–1.2)

## 2016-12-13 LAB — LIPID PANEL W/REFLEX DIRECT LDL
Cholesterol: 131 mg/dL (ref <200)
HDL: 44 mg/dL — ABNORMAL LOW (ref >50)
LDL-Cholesterol: 67 mg/dL
Non-HDL Cholesterol (Calc): 87 mg/dL (ref <130)
Total CHOL/HDL Ratio: 3 ratio (ref <5.0)
Triglycerides: 112 mg/dL (ref <150)

## 2016-12-13 LAB — CBC
HCT: 42.3 % (ref 35.0–45.0)
Hemoglobin: 14 g/dL (ref 11.7–15.5)
MCH: 31.2 pg (ref 27.0–33.0)
MCHC: 33.1 g/dL (ref 32.0–36.0)
MCV: 94.2 fL (ref 80.0–100.0)
MPV: 9.6 fL (ref 7.5–12.5)
Platelets: 305 10*3/uL (ref 140–400)
RBC: 4.49 MIL/uL (ref 3.80–5.10)
RDW: 13.4 % (ref 11.0–15.0)
WBC: 9.6 K/uL (ref 3.8–10.8)

## 2016-12-13 LAB — TSH: TSH: 2.51 m[IU]/L

## 2016-12-13 LAB — COMPREHENSIVE METABOLIC PANEL
Albumin: 4.2 g/dL (ref 3.6–5.1)
BUN: 16 mg/dL (ref 7–25)
Chloride: 112 mmol/L — ABNORMAL HIGH (ref 98–110)
Potassium: 4.7 mmol/L (ref 3.5–5.3)
Total Protein: 6.8 g/dL (ref 6.1–8.1)

## 2016-12-14 LAB — HEMOGLOBIN A1C
Hgb A1c MFr Bld: 6 % — ABNORMAL HIGH (ref <5.7)
Mean Plasma Glucose: 126 mg/dL

## 2016-12-14 LAB — HEPATITIS C ANTIBODY: HCV Ab: NEGATIVE

## 2017-01-03 DIAGNOSIS — Z87891 Personal history of nicotine dependence: Secondary | ICD-10-CM | POA: Diagnosis not present

## 2017-01-16 ENCOUNTER — Other Ambulatory Visit: Payer: Self-pay | Admitting: Sports Medicine

## 2017-01-16 DIAGNOSIS — E669 Obesity, unspecified: Secondary | ICD-10-CM

## 2017-01-16 DIAGNOSIS — E1169 Type 2 diabetes mellitus with other specified complication: Secondary | ICD-10-CM

## 2017-02-06 DIAGNOSIS — M810 Age-related osteoporosis without current pathological fracture: Secondary | ICD-10-CM | POA: Diagnosis not present

## 2017-02-06 DIAGNOSIS — C50012 Malignant neoplasm of nipple and areola, left female breast: Secondary | ICD-10-CM | POA: Diagnosis not present

## 2017-02-06 DIAGNOSIS — Z170 Estrogen receptor positive status [ER+]: Secondary | ICD-10-CM | POA: Diagnosis not present

## 2017-02-06 DIAGNOSIS — Z5181 Encounter for therapeutic drug level monitoring: Secondary | ICD-10-CM | POA: Diagnosis not present

## 2017-02-06 DIAGNOSIS — Z79811 Long term (current) use of aromatase inhibitors: Secondary | ICD-10-CM | POA: Diagnosis not present

## 2017-04-24 DIAGNOSIS — Z170 Estrogen receptor positive status [ER+]: Secondary | ICD-10-CM | POA: Diagnosis not present

## 2017-04-24 DIAGNOSIS — C50012 Malignant neoplasm of nipple and areola, left female breast: Secondary | ICD-10-CM | POA: Diagnosis not present

## 2017-04-24 DIAGNOSIS — N6489 Other specified disorders of breast: Secondary | ICD-10-CM | POA: Diagnosis not present

## 2017-04-24 DIAGNOSIS — Z853 Personal history of malignant neoplasm of breast: Secondary | ICD-10-CM | POA: Diagnosis not present

## 2017-05-24 DIAGNOSIS — I2510 Atherosclerotic heart disease of native coronary artery without angina pectoris: Secondary | ICD-10-CM | POA: Diagnosis not present

## 2017-05-24 DIAGNOSIS — I10 Essential (primary) hypertension: Secondary | ICD-10-CM | POA: Diagnosis not present

## 2017-05-24 DIAGNOSIS — Z951 Presence of aortocoronary bypass graft: Secondary | ICD-10-CM | POA: Diagnosis not present

## 2017-05-24 DIAGNOSIS — E7800 Pure hypercholesterolemia, unspecified: Secondary | ICD-10-CM | POA: Diagnosis not present

## 2017-08-04 ENCOUNTER — Other Ambulatory Visit: Payer: Self-pay | Admitting: Sports Medicine

## 2017-08-30 ENCOUNTER — Ambulatory Visit (INDEPENDENT_AMBULATORY_CARE_PROVIDER_SITE_OTHER): Payer: Medicare Other | Admitting: Sports Medicine

## 2017-08-30 DIAGNOSIS — M722 Plantar fascial fibromatosis: Secondary | ICD-10-CM | POA: Diagnosis not present

## 2017-08-30 DIAGNOSIS — G5601 Carpal tunnel syndrome, right upper limb: Secondary | ICD-10-CM | POA: Diagnosis not present

## 2017-08-30 DIAGNOSIS — H6983 Other specified disorders of Eustachian tube, bilateral: Secondary | ICD-10-CM

## 2017-08-30 DIAGNOSIS — H6993 Unspecified Eustachian tube disorder, bilateral: Secondary | ICD-10-CM | POA: Insufficient documentation

## 2017-08-30 DIAGNOSIS — G5600 Carpal tunnel syndrome, unspecified upper limb: Secondary | ICD-10-CM | POA: Insufficient documentation

## 2017-08-30 MED ORDER — FLUTICASONE PROPIONATE 50 MCG/ACT NA SUSP: NASAL | 3 refills | Status: DC

## 2017-08-30 NOTE — Assessment & Plan Note (Signed)
Patient will purchase her own wrist splint. Rehab exercises given. After 1 month of nocturnal wrist splint use if she is still having symptoms we will proceed with a Hydro dissection.

## 2017-08-30 NOTE — Progress Notes (Signed)
Subjective:    CC: Multiple issues  HPI: Right heel pain: Present for several weeks, worse with the first few steps in the morning, localized on the plantar aspect of the calcaneus.  Severe, persistent, localized without radiation.  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  Nasal stuffiness: With bilateral sinus pressure for the past couple weeks, better with decongestants.  No constitutional symptoms, no double sickening, no cough, no radiation to the teeth.  I reviewed the past medical history, family history, social history, surgical history, and allergies today and no changes were needed.  Please see the problem list section below in epic for further details.  Past Medical History: Past Medical History:  Diagnosis Date  . Hyperlipidemia   . Hypertension    Past Surgical History: Past Surgical History:  Procedure Laterality Date  . CESAREAN SECTION    . CORONARY ARTERY BYPASS GRAFT     Social History: Social History   Socioeconomic History  . Marital status: Married    Spouse name: Not on file  . Number of children: Not on file  . Years of education: Not on file  . Highest education level: Not on file  Social Needs  . Financial resource strain: Not on file  . Food insecurity - worry: Not on file  . Food insecurity - inability: Not on file  . Transportation needs - medical: Not on file  . Transportation needs - non-medical: Not on file  Occupational History  . Not on file  Tobacco Use  . Smoking status: Former Research scientist (life sciences)  . Smokeless tobacco: Never Used  Substance and Sexual Activity  . Alcohol use: No    Alcohol/week: 0.0 oz  . Drug use: No  . Sexual activity: No  Other Topics Concern  . Not on file  Social History Narrative  . Not on file   Family History: No family history on file. Allergies: Allergies  Allergen Reactions  . Influenza Vaccines    Medications:  See med rec.  Review of Systems: No fevers, chills, night sweats, weight loss, chest pain, or shortness of breath.   Objective:    General: Well Developed, well nourished, and in no acute distress.  Neuro: Alert and oriented x3, extra-ocular muscles intact, sensation grossly intact.  HEENT: Normocephalic, atraumatic, pupils equal round reactive to light, neck supple, no masses, no lymphadenopathy, thyroid nonpalpable.  Oropharynx, nasopharynx, ear canals unremarkable, no tenderness over the frontal or maxillary sinuses Skin: Warm and dry, no rashes. Cardiac: Regular rate and rhythm, no murmurs rubs or gallops, no lower extremity edema.  Respiratory: Clear to auscultation bilaterally. Not using accessory muscles, speaking in full sentences. Right wrist: Inspection normal with no visible erythema or swelling. ROM smooth and normal with good flexion and extension and ulnar/radial deviation that is symmetrical with opposite wrist. Palpation is normal over metacarpals, navicular, lunate, and TFCC; tendons without tenderness/ swelling No snuffbox tenderness. No tenderness over Canal of Guyon. Strength 5/5 in all directions without pain. Positive Tinel's and phalens signs. Negative Finkelstein sign. Negative Watson's test. Right foot: No visible erythema or swelling. Range of motion is full in all directions. Strength is 5/5 in all directions. No hallux valgus. No pes cavus or pes planus. No abnormal callus noted. No pain over the navicular prominence, or base of fifth metatarsal. Severe tenderness to palpation of the calcaneal insertion of plantar fascia. No pain at the Achilles insertion. No pain over the  calcaneal bursa. No pain of the retrocalcaneal bursa. No tenderness to palpation over the tarsals, metatarsals, or phalanges. No hallux rigidus or limitus. No tenderness palpation over interphalangeal joints. No pain with compression of the metatarsal heads. Neurovascularly intact  distally.  Impression and Recommendations:    Eustachian tube dysfunction, bilateral Adding intranasal fluticasone to use daily. She is going to decrease her use of decongestants.  Carpal tunnel syndrome on right Patient will purchase her own wrist splint. Rehab exercises given. After 1 month of nocturnal wrist splint use if she is still having symptoms we will proceed with a Hydro dissection.  Plantar fasciitis, right Rehab exercises given, avoid barefoot walking. If no better in a month we will do an injection. ___________________________________________ [name]. Dianah Field, M.D., ABFM., CAQSM. Primary Care and [place] Instructor of [place] of [facility] of Medicine

## 2017-08-30 NOTE — Assessment & Plan Note (Signed)
Adding intranasal fluticasone to use daily. She is going to decrease her use of decongestants.

## 2017-08-30 NOTE — Assessment & Plan Note (Signed)
Rehab exercises given, avoid barefoot walking. If no better in a month we will do an injection.

## 2017-08-30 NOTE — Patient Instructions (Signed)
Eustachian Tube Dysfunction The eustachian tube connects the middle ear to the back of the nose. It regulates air pressure in the middle ear by allowing air to move between the ear and nose. It also helps to drain fluid from the middle ear space. When the eustachian tube does not function properly, air pressure, fluid, or both can build up in the middle ear. Eustachian tube dysfunction can affect one or both ears. What are the causes? This condition happens when the eustachian tube becomes blocked or cannot open normally. This may result from:  Ear infections.  Colds and other upper respiratory infections.  Allergies.  Irritation, such as from cigarette smoke or acid from the stomach coming up into the esophagus (gastroesophageal reflux).  Sudden changes in air pressure, such as from descending in an airplane.  Abnormal growths in the nose or throat, such as nasal polyps, tumors, or enlarged tissue at the back of the throat (adenoids).  What increases the risk? This condition may be more likely to develop in people who smoke and people who are overweight. Eustachian tube dysfunction may also be more likely to develop in children, especially children who have:  Certain birth defects of the mouth, such as cleft palate.  Large tonsils and adenoids.  What are the signs or symptoms? Symptoms of this condition may include:  A feeling of fullness in the ear.  Ear pain.  Clicking or popping noises in the ear.  Ringing in the ear.  Hearing loss.  Loss of balance.  Symptoms may get worse when the air pressure around you changes, such as when you travel to an area of high elevation or fly on an airplane. How is this diagnosed? This condition may be diagnosed based on:  Your symptoms.  A physical exam of your ear, nose, and throat.  Tests, such as those that measure: ? The movement of your eardrum (tympanogram). ? Your hearing (audiometry).  How is this treated? Treatment  depends on the cause and severity of your condition. If your symptoms are mild, you may be able to relieve your symptoms by moving air into ("popping") your ears. If you have symptoms of fluid in your ears, treatment may include:  Decongestants.  Antihistamines.  Nasal sprays or ear drops that contain medicines that reduce swelling (steroids).  In some cases, you may need to have a procedure to drain the fluid in your eardrum (myringotomy). In this procedure, a small tube is placed in the eardrum to:  Drain the fluid.  Restore the air in the middle ear space.  Follow these instructions at home:  Take over-the-counter and prescription medicines only as told by your health care provider.  Use techniques to help pop your ears as recommended by your health care provider. These may include: ? Chewing gum. ? Yawning. ? Frequent, forceful swallowing. ? Closing your mouth, holding your nose closed, and gently blowing as if you are trying to blow air out of your nose.  Do not do any of the following until your health care provider approves: ? Travel to high altitudes. ? Fly in airplanes. ? Work in a pressurized cabin or room. ? Scuba dive.  Keep your ears dry. Dry your ears completely after showering or bathing.  Do not smoke.  Keep all follow-up visits as told by your health care provider. This is important. Contact a health care provider if:  Your symptoms do not go away after treatment.  Your symptoms come back after treatment.  You are   unable to pop your ears.  You have: ? A fever. ? Pain in your ear. ? Pain in your head or neck. ? Fluid draining from your ear.  Your hearing suddenly changes.  You become very dizzy.  You lose your balance. This information is not intended to replace advice given to you by your health care provider. Make sure you discuss any questions you have with your health care provider. Document Released: 08/13/2015 Document Revised: 12/23/2015  Document Reviewed: 08/05/2014 Elsevier Interactive Patient Education  2018 Elsevier Inc.  

## 2017-09-27 ENCOUNTER — Encounter: Payer: Self-pay | Admitting: Sports Medicine

## 2017-09-27 ENCOUNTER — Ambulatory Visit (INDEPENDENT_AMBULATORY_CARE_PROVIDER_SITE_OTHER): Payer: Medicare Other | Admitting: Sports Medicine

## 2017-09-27 DIAGNOSIS — G5601 Carpal tunnel syndrome, right upper limb: Secondary | ICD-10-CM | POA: Diagnosis not present

## 2017-09-27 DIAGNOSIS — H6993 Unspecified Eustachian tube disorder, bilateral: Secondary | ICD-10-CM

## 2017-09-27 DIAGNOSIS — H6983 Other specified disorders of Eustachian tube, bilateral: Secondary | ICD-10-CM | POA: Diagnosis not present

## 2017-09-27 DIAGNOSIS — M722 Plantar fascial fibromatosis: Secondary | ICD-10-CM | POA: Diagnosis not present

## 2017-09-27 NOTE — Assessment & Plan Note (Signed)
Resolved

## 2017-09-27 NOTE — Progress Notes (Signed)
Subjective:    CC: Follow-up  HPI: Plantar fasciitis: Resolved  Hearing loss: Resolved with a $10 [employer] hearing aid.  Carpal tunnel syndrome: Persistent in spite of NSAIDs, rehab exercises, nocturnal splinting.  I reviewed the past medical history, family history, social history, surgical history, and allergies today and no changes were needed.  Please see the problem list section below in epic for further details.  Past Medical History: Past Medical History:  Diagnosis Date  . Hyperlipidemia   . Hypertension    Past Surgical History: Past Surgical History:  Procedure Laterality Date  . CESAREAN SECTION    . CORONARY ARTERY BYPASS GRAFT     Social History: Social History   Socioeconomic History  . Marital status: Married    Spouse name: None  . Number of children: None  . Years of education: None  . Highest education level: None  Social Needs  . Financial resource strain: None  . Food insecurity - worry: None  . Food insecurity - inability: None  . Transportation needs - medical: None  . Transportation needs - non-medical: None  Occupational History  . None  Tobacco Use  . Smoking status: Former Research scientist (life sciences)  . Smokeless tobacco: Never Used  Substance and Sexual Activity  . Alcohol use: No    Alcohol/week: 0.0 oz  . Drug use: No  . Sexual activity: No  Other Topics Concern  . None  Social History Narrative  . None   Family History: No family history on file. Allergies: Allergies  Allergen Reactions  . Influenza Vaccines    Medications: See med rec.  Review of Systems: No fevers, chills, night sweats, weight loss, chest pain, or shortness of breath.   Objective:    General: Well Developed, well nourished, and in no acute distress.  Neuro: Alert and oriented x3, extra-ocular muscles intact, sensation grossly intact.  HEENT: Normocephalic, atraumatic, pupils equal round reactive to light, neck supple, no masses, no lymphadenopathy, thyroid nonpalpable.    Skin: Warm and dry, no rashes. Cardiac: Regular rate and rhythm, no murmurs rubs or gallops, no lower extremity edema.  Respiratory: Clear to auscultation bilaterally. Not using accessory muscles, speaking in full sentences.  Procedure: Real-time Ultrasound Guided right median nerve hydrodissection Device: GE Logiq E  Verbal informed consent obtained.  Time-out conducted.  Noted no overlying erythema, induration, or other signs of local infection.  Skin prepped in a sterile fashion.  Local anesthesia: Topical Ethyl chloride.  With sterile technique and under real time ultrasound guidance: Using a 25-gauge needle and taking care to avoid intraneural injection I injected medication both superficial to and deep to the median nerve in the carpal tunnel, I then redirected the needle deep into the flexor tendons and injected the rest of medication for a total of 5 cc lidocaine, 1 cc kenalog 40.   Completed without difficulty  Pain immediately resolved suggesting accurate placement of the medication.  Advised to call if fevers/chills, erythema, induration, drainage, or persistent bleeding.  Images permanently stored and available for review in the ultrasound unit.  Impression: Technically successful ultrasound guided injection.  Impression and Recommendations:    Carpal tunnel syndrome on right At this point persistent failure of multiple modalities. Proceed with median nerve hydrodissection. Return in 1 month.  Eustachian tube dysfunction, bilateral Resolved.  Plantar fasciitis, right Resolved. ___________________________________________ [name]. Dianah Field, M.D., ABFM., CAQSM. Primary Care and [place] Instructor of [place] of [facility] of Medicine

## 2017-09-27 NOTE — Assessment & Plan Note (Signed)
At this point persistent failure of multiple modalities. Proceed with median nerve hydrodissection. Return in 1 month.

## 2017-10-02 ENCOUNTER — Other Ambulatory Visit: Payer: Self-pay | Admitting: Sports Medicine

## 2017-10-02 DIAGNOSIS — E1169 Type 2 diabetes mellitus with other specified complication: Secondary | ICD-10-CM

## 2017-10-02 DIAGNOSIS — E669 Obesity, unspecified: Secondary | ICD-10-CM

## 2017-10-04 ENCOUNTER — Other Ambulatory Visit: Payer: Self-pay

## 2017-10-04 DIAGNOSIS — H6983 Other specified disorders of Eustachian tube, bilateral: Secondary | ICD-10-CM

## 2017-10-04 MED ORDER — FLUTICASONE PROPIONATE 50 MCG/ACT NA SUSP: NASAL | 3 refills | Status: DC

## 2017-10-21 ENCOUNTER — Other Ambulatory Visit: Payer: Self-pay | Admitting: Sports Medicine

## 2017-10-25 ENCOUNTER — Ambulatory Visit: Payer: Medicare Other | Admitting: Sports Medicine

## 2018-01-11 ENCOUNTER — Ambulatory Visit (INDEPENDENT_AMBULATORY_CARE_PROVIDER_SITE_OTHER): Payer: Medicare Other | Admitting: Sports Medicine

## 2018-01-11 DIAGNOSIS — G5603 Carpal tunnel syndrome, bilateral upper limbs: Secondary | ICD-10-CM | POA: Diagnosis not present

## 2018-01-11 NOTE — Progress Notes (Signed)
Subjective:    CC: Left hand numbness and tingling  HPI: This is a pleasant 67 year old female, she has known carpal tunnel syndrome, we did a median nerve hydrodissection several months ago on the right, she is doing extremely well.  Now having increasing numbness, tingling, burning, pain in the left hand, moderate, worsening.  Nighttime splinting, NSAIDs, stretches are not helping her.  She desires interventional treatment today.  I reviewed the past medical history, family history, social history, surgical history, and allergies today and no changes were needed.  Please see the problem list section below in epic for further details.  Past Medical History: Past Medical History:  Diagnosis Date  . Hyperlipidemia   . Hypertension    Past Surgical History: Past Surgical History:  Procedure Laterality Date  . CESAREAN SECTION    . CORONARY ARTERY BYPASS GRAFT     Social History: Social History   Socioeconomic History  . Marital status: Married    Spouse name: Not on file  . Number of children: Not on file  . Years of education: Not on file  . Highest education level: Not on file  Occupational History  . Not on file  Social Needs  . Financial resource strain: Not on file  . Food insecurity:    Worry: Not on file    Inability: Not on file  . Transportation needs:    Medical: Not on file    Non-medical: Not on file  Tobacco Use  . Smoking status: Former Research scientist (life sciences)  . Smokeless tobacco: Never Used  Substance and Sexual Activity  . Alcohol use: No    Alcohol/week: 0.0 oz  . Drug use: No  . Sexual activity: Never  Lifestyle  . Physical activity:    Days per week: Not on file    Minutes per session: Not on file  . Stress: Not on file  Relationships  . Social connections:    Talks on phone: Not on file    Gets together: Not on file    Attends religious service: Not on file    Active member of club or organization: Not on file    Attends meetings of clubs or  organizations: Not on file    Relationship status: Not on file  Other Topics Concern  . Not on file  Social History Narrative  . Not on file   Family History: No family history on file. Allergies: Allergies  Allergen Reactions  . Influenza Vaccines    Medications: See med rec.  Review of Systems: No fevers, chills, night sweats, weight loss, chest pain, or shortness of breath.   Objective:    General: Well Developed, well nourished, and in no acute distress.  Neuro: Alert and oriented x3, extra-ocular muscles intact, sensation grossly intact.  HEENT: Normocephalic, atraumatic, pupils equal round reactive to light, neck supple, no masses, no lymphadenopathy, thyroid nonpalpable.  Skin: Warm and dry, no rashes. Cardiac: Regular rate and rhythm, no murmurs rubs or gallops, no lower extremity edema.  Respiratory: Clear to auscultation bilaterally. Not using accessory muscles, speaking in full sentences.  Procedure: Real-time Ultrasound Guided hydrodissection of the left median nerve at the carpal tunnel Device: GE Logiq E  Verbal informed consent obtained.  Time-out conducted.  Noted no overlying erythema, induration, or other signs of local infection.  Skin prepped in a sterile fashion.  Local anesthesia: Topical Ethyl chloride.  With sterile technique and under real time ultrasound guidance: Using a 25-gauge needle advanced into the carpal tunnel, taking care  to avoid intraneural injection I injected medication both superficial to and deep to the median nerve freeing it from surrounding structures, I then redirected the needle deep and injected further medication around the flexor tendons deep within the carpal tunnel for a total of 1 cc kenalog 40, 5 cc lidocaine. Completed without difficulty  Advised to call if fevers/chills, erythema, induration, drainage, or persistent bleeding.  Images permanently stored and available for review in the ultrasound unit.  Impression:  Technically successful ultrasound guided median nerve hydrodissection.  Impression and Recommendations:    Carpal tunnel syndrome Good response to median nerve hydrodissection approximately 4 months ago, repeated today on the left. Return as needed. ___________________________________________ [name]. Dianah Field, M.D., ABFM., CAQSM. Primary Care and [place] Instructor of [place] of [facility] of Medicine

## 2018-01-11 NOTE — Assessment & Plan Note (Signed)
Good response to median nerve hydrodissection approximately 4 months ago, repeated today on the left. Return as needed.

## 2018-01-23 ENCOUNTER — Ambulatory Visit (INDEPENDENT_AMBULATORY_CARE_PROVIDER_SITE_OTHER): Payer: Medicare Other | Admitting: Sports Medicine

## 2018-01-23 ENCOUNTER — Ambulatory Visit (INDEPENDENT_AMBULATORY_CARE_PROVIDER_SITE_OTHER): Payer: Medicare Other

## 2018-01-23 VITALS — BP 121/72 | HR 54 | Ht <= 58 in | Wt 144.0 lb

## 2018-01-23 DIAGNOSIS — E785 Hyperlipidemia, unspecified: Secondary | ICD-10-CM

## 2018-01-23 DIAGNOSIS — E669 Obesity, unspecified: Secondary | ICD-10-CM

## 2018-01-23 DIAGNOSIS — E1169 Type 2 diabetes mellitus with other specified complication: Secondary | ICD-10-CM | POA: Diagnosis not present

## 2018-01-23 DIAGNOSIS — Z111 Encounter for screening for respiratory tuberculosis: Secondary | ICD-10-CM

## 2018-01-23 DIAGNOSIS — Z0000 Encounter for general adult medical examination without abnormal findings: Secondary | ICD-10-CM | POA: Diagnosis not present

## 2018-01-23 DIAGNOSIS — G5603 Carpal tunnel syndrome, bilateral upper limbs: Secondary | ICD-10-CM | POA: Diagnosis not present

## 2018-01-23 DIAGNOSIS — I10 Essential (primary) hypertension: Secondary | ICD-10-CM

## 2018-01-23 DIAGNOSIS — M810 Age-related osteoporosis without current pathological fracture: Secondary | ICD-10-CM

## 2018-01-23 DIAGNOSIS — F4323 Adjustment disorder with mixed anxiety and depressed mood: Secondary | ICD-10-CM | POA: Diagnosis not present

## 2018-01-23 DIAGNOSIS — F4320 Adjustment disorder, unspecified: Secondary | ICD-10-CM | POA: Insufficient documentation

## 2018-01-23 MED ORDER — LISINOPRIL 40 MG PO TABS: 40.0000 mg | ORAL_TABLET | Freq: Every day | ORAL | 3 refills | Status: DC

## 2018-01-23 MED ORDER — CARVEDILOL 3.125 MG PO TABS: 3.1250 mg | ORAL_TABLET | Freq: Two times a day (BID) | ORAL | 3 refills | Status: DC

## 2018-01-23 NOTE — Assessment & Plan Note (Signed)
Stable, continue metformin 

## 2018-01-23 NOTE — Assessment & Plan Note (Addendum)
Annual physical as above.  Routine labs ordered. 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

## 2018-01-23 NOTE — Assessment & Plan Note (Addendum)
Controlled, slightly bradycardic, decreasing carvedilol to 3.125 mg twice a day.

## 2018-01-23 NOTE — Assessment & Plan Note (Signed)
Minimal symptoms, the center around work, and arguments with her husband. We will discuss this again in a month and offer pharmacotherapy if still no better.

## 2018-01-23 NOTE — Assessment & Plan Note (Signed)
Stable, rechecking lipids

## 2018-01-23 NOTE — Assessment & Plan Note (Signed)
Median nerve hydrodissection on the left earlier this month, now with a recurrence of symptoms. She is not wearing her nighttime splinting. She will restart nighttime splinting for a month, return to see me, and I will probably refer her for carpal tunnel release on the left if still no improvement versus trying a second median nerve hydrodissection.

## 2018-01-23 NOTE — Progress Notes (Addendum)
Subjective:    CC: Annual physical  HPI:  Tracey Marshall is here for her physical, she is up-to-date on breast cancer screening, had a mammogram last month at an outside facility, she is due for her bone densitometry, she has been on Fosamax now for 2 years.  Due for some routine labs and needs a QuantiFERON gold for TB screening, she does work in a nursing home.  We did a left carpal tunnel injection earlier this month, she has not been wearing her nighttime splinting and is noting an increase in symptoms.  I reviewed the past medical history, family history, social history, surgical history, and allergies today and no changes were needed.  Please see the problem list section below in epic for further details.  Past Medical History: Past Medical History:  Diagnosis Date  . Hyperlipidemia   . Hypertension    Past Surgical History: Past Surgical History:  Procedure Laterality Date  . CESAREAN SECTION    . CORONARY ARTERY BYPASS GRAFT     Social History: Social History   Socioeconomic History  . Marital status: Married    Spouse name: Not on file  . Number of children: Not on file  . Years of education: Not on file  . Highest education level: Not on file  Occupational History  . Not on file  Social Needs  . Financial resource strain: Not on file  . Food insecurity:    Worry: Not on file    Inability: Not on file  . Transportation needs:    Medical: Not on file    Non-medical: Not on file  Tobacco Use  . Smoking status: Former Research scientist (life sciences)  . Smokeless tobacco: Never Used  Substance and Sexual Activity  . Alcohol use: No    Alcohol/week: 0.0 oz  . Drug use: No  . Sexual activity: Never  Lifestyle  . Physical activity:    Days per week: Not on file    Minutes per session: Not on file  . Stress: Not on file  Relationships  . Social connections:    Talks on phone: Not on file    Gets together: Not on file    Attends religious service: Not on file    Active member of club or  organization: Not on file    Attends meetings of clubs or organizations: Not on file    Relationship status: Not on file  Other Topics Concern  . Not on file  Social History Narrative  . Not on file   Family History: No family history on file. Allergies: Allergies  Allergen Reactions  . Influenza Vaccines    Medications: See med rec.  Review of Systems: No headache, visual changes, nausea, vomiting, diarrhea, constipation, dizziness, abdominal pain, skin rash, fevers, chills, night sweats, swollen lymph nodes, weight loss, chest pain, body aches, joint swelling, muscle aches, shortness of breath, mood changes, visual or auditory hallucinations.  Objective:    General: Well Developed, well nourished, and in no acute distress.  Neuro: Alert and oriented x3, extra-ocular muscles intact, sensation grossly intact. Cranial nerves II through XII are intact, motor, sensory, and coordinative functions are all intact. HEENT: Normocephalic, atraumatic, pupils equal round reactive to light, neck supple, no masses, no lymphadenopathy, thyroid nonpalpable. Oropharynx, nasopharynx, external ear canals are unremarkable. Skin: Warm and dry, no rashes noted.  Cardiac: Regular rate and rhythm, no murmurs rubs or gallops.  Respiratory: Clear to auscultation bilaterally. Not using accessory muscles, speaking in full sentences.  Abdominal: Soft, nontender, nondistended,  positive bowel sounds, no masses, no organomegaly.  Musculoskeletal: Shoulder, elbow, wrist, hip, knee, ankle stable, and with full range of motion.  Impression and Recommendations:    The patient was counselled, risk factors were discussed, anticipatory guidance given.  Annual physical exam Annual physical as above.  Routine labs ordered. 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  Hyperlipidemia Stable, rechecking lipids  Essential hypertension, benign Controlled, slightly bradycardic, decreasing carvedilol to 3.125  mg twice a day.  Diabetes mellitus type 2 in obese (HCC) Stable, continue metformin.  Osteoporosis Rechecking bone density test, has been stable on Fosamax, previous test was 2 years ago.  Bone density in the lumbar spine has improved, it is essentially the same at the femoral neck, still has osteoporosis but improved so continue Fosamax for now.  Carpal tunnel syndrome Median nerve hydrodissection on the left earlier this month, now with a recurrence of symptoms. She is not wearing her nighttime splinting. She will restart nighttime splinting for a month, return to see me, and I will probably refer her for carpal tunnel release on the left if still no improvement versus trying a second median nerve hydrodissection.  Adjustment disorder Minimal symptoms, the center around work, and arguments with her husband. We will discuss this again in a month and offer pharmacotherapy if still no better. ___________________________________________ [name]. Dianah Field, M.D., ABFM., CAQSM. Primary Care and [place] Instructor of [place] of [facility] of Medicine

## 2018-01-23 NOTE — Assessment & Plan Note (Addendum)
Rechecking bone density test, has been stable on Fosamax, previous test was 2 years ago.  Bone density in the lumbar spine has improved, it is essentially the same at the femoral neck, still has osteoporosis but improved so continue Fosamax for now.

## 2018-01-25 LAB — COMPREHENSIVE METABOLIC PANEL
ALT: 22 U/L (ref 6–29)
AST: 19 U/L (ref 10–35)
Alkaline phosphatase (APISO): 68 U/L (ref 33–130)
BUN: 25 mg/dL (ref 7–25)
Creat: 1.11 mg/dL — ABNORMAL HIGH (ref 0.50–0.99)

## 2018-01-25 LAB — CBC
HCT: 43.7 % (ref 35.0–45.0)
Hemoglobin: 14.3 g/dL (ref 11.7–15.5)
MCH: 30.8 pg (ref 27.0–33.0)
MCHC: 32.7 g/dL (ref 32.0–36.0)
MCV: 94 fL (ref 80.0–100.0)
MPV: 9.3 fL (ref 7.5–12.5)
Platelets: 334 10*3/uL (ref 140–400)
RBC: 4.65 Million/uL (ref 3.80–5.10)
RDW: 12.2 % (ref 11.0–15.0)
WBC: 10.9 Thousand/uL — ABNORMAL HIGH (ref 3.8–10.8)

## 2018-01-25 LAB — HEMOGLOBIN A1C
Hgb A1c MFr Bld: 5.8 % of total Hgb — ABNORMAL HIGH (ref <5.7)
Mean Plasma Glucose: 120 (calc)
eAG (mmol/L): 6.6 (calc)

## 2018-01-25 LAB — LIPID PANEL W/REFLEX DIRECT LDL
Cholesterol: 159 mg/dL (ref <200)
HDL: 50 mg/dL — ABNORMAL LOW (ref >50)
LDL Cholesterol (Calc): 91 mg/dL (calc)
Non-HDL Cholesterol (Calc): 109 mg/dL (ref <130)
Total CHOL/HDL Ratio: 3.2 (calc) (ref <5.0)
Triglycerides: 87 mg/dL (ref <150)

## 2018-01-25 LAB — COMPREHENSIVE METABOLIC PANEL WITH GFR
AG Ratio: 1.6 (calc) (ref 1.0–2.5)
Albumin: 4.4 g/dL (ref 3.6–5.1)
BUN/Creatinine Ratio: 23 (calc) — ABNORMAL HIGH (ref 6–22)
CO2: 27 mmol/L (ref 20–32)
Calcium: 9.5 mg/dL (ref 8.6–10.4)
Chloride: 107 mmol/L (ref 98–110)
Globulin: 2.7 g/dL (ref 1.9–3.7)
Glucose, Bld: 104 mg/dL — ABNORMAL HIGH (ref 65–99)
Potassium: 4.3 mmol/L (ref 3.5–5.3)
Sodium: 141 mmol/L (ref 135–146)
Total Bilirubin: 0.6 mg/dL (ref 0.2–1.2)
Total Protein: 7.1 g/dL (ref 6.1–8.1)

## 2018-01-25 LAB — QUANTIFERON-TB GOLD PLUS
Mitogen-NIL: >10 IU/mL
NIL: 0.04 IU/mL
QuantiFERON-TB Gold Plus: NEGATIVE
TB1-NIL: <0 IU/mL
TB2-NIL: <0 [IU]/mL

## 2018-01-28 ENCOUNTER — Encounter: Payer: Self-pay | Admitting: Sports Medicine

## 2018-03-08 ENCOUNTER — Encounter: Payer: Self-pay | Admitting: Sports Medicine

## 2018-03-08 ENCOUNTER — Ambulatory Visit (INDEPENDENT_AMBULATORY_CARE_PROVIDER_SITE_OTHER): Payer: Medicare Other | Admitting: Sports Medicine

## 2018-03-08 DIAGNOSIS — G5603 Carpal tunnel syndrome, bilateral upper limbs: Secondary | ICD-10-CM

## 2018-03-08 NOTE — Progress Notes (Signed)
  Subjective:    CC: Follow-up  HPI: Carpal tunnel syndrome: Bilateral, we have done bilateral median nerve hydrodissections at this point, she is doing extremely well with complete resolution of all of her symptoms.  She is still not diligent with her nighttime splinting.  I reviewed the past medical history, family history, social history, surgical history, and allergies today and no changes were needed.  Please see the problem list section below in epic for further details.  Past Medical History: Past Medical History:  Diagnosis Date  . Hyperlipidemia   . Hypertension    Past Surgical History: Past Surgical History:  Procedure Laterality Date  . CESAREAN SECTION    . CORONARY ARTERY BYPASS GRAFT     Social History: Social History   Socioeconomic History  . Marital status: Married    Spouse name: Not on file  . Number of children: Not on file  . Years of education: Not on file  . Highest education level: Not on file  Occupational History  . Not on file  Social Needs  . Financial resource strain: Not on file  . Food insecurity:    Worry: Not on file    Inability: Not on file  . Transportation needs:    Medical: Not on file    Non-medical: Not on file  Tobacco Use  . Smoking status: Former Research scientist (life sciences)  . Smokeless tobacco: Never Used  Substance and Sexual Activity  . Alcohol use: No    Alcohol/week: 0.0 standard drinks  . Drug use: No  . Sexual activity: Never  Lifestyle  . Physical activity:    Days per week: Not on file    Minutes per session: Not on file  . Stress: Not on file  Relationships  . Social connections:    Talks on phone: Not on file    Gets together: Not on file    Attends religious service: Not on file    Active member of club or organization: Not on file    Attends meetings of clubs or organizations: Not on file    Relationship status: Not on file  Other Topics Concern  . Not on file  Social History Narrative  . Not on file   Family  History: No family history on file. Allergies: Allergies  Allergen Reactions  . Influenza Vaccines    Medications: See med rec.  Review of Systems: No fevers, chills, night sweats, weight loss, chest pain, or shortness of breath.   Objective:    General: Well Developed, well nourished, and in no acute distress.  Neuro: Alert and oriented x3, extra-ocular muscles intact, sensation grossly intact.  HEENT: Normocephalic, atraumatic, pupils equal round reactive to light, neck supple, no masses, no lymphadenopathy, thyroid nonpalpable.  Skin: Warm and dry, no rashes. Cardiac: Regular rate and rhythm, no murmurs rubs or gallops, no lower extremity edema.  Respiratory: Clear to auscultation bilaterally. Not using accessory muscles, speaking in full sentences.  Impression and Recommendations:    Carpal tunnel syndrome Doing well after bilateral median nerve hydrodissection several months ago. She does need to increase her compliance with nighttime splinting. ___________________________________________ [name]. Dianah Field, M.D., ABFM., CAQSM. Primary Care and [place] Instructor of [place] [facility] of Medicine

## 2018-03-08 NOTE — Assessment & Plan Note (Signed)
Doing well after bilateral median nerve hydrodissection several months ago. She does need to increase her compliance with nighttime splinting.

## 2018-06-03 ENCOUNTER — Encounter: Payer: Self-pay | Admitting: Sports Medicine

## 2018-06-03 ENCOUNTER — Ambulatory Visit (INDEPENDENT_AMBULATORY_CARE_PROVIDER_SITE_OTHER): Payer: Medicare Other | Admitting: Sports Medicine

## 2018-06-03 DIAGNOSIS — G5603 Carpal tunnel syndrome, bilateral upper limbs: Secondary | ICD-10-CM | POA: Diagnosis not present

## 2018-06-03 NOTE — Assessment & Plan Note (Signed)
Repeat median nerve hydrodissection on the right, previous injection was bilateral and 6+ months ago.

## 2018-06-03 NOTE — Progress Notes (Signed)
Subjective:    CC: Right hand pain  HPI: This is a pleasant 67 year old female, for the past couple of weeks she is had worsening pain in her right wrist, numbness and tingling to the hand and fingertips.  Worse at night.  She has been wearing her splint, previous median nerve hydrodissection was over 6 months ago.  I reviewed the past medical history, family history, social history, surgical history, and allergies today and no changes were needed.  Please see the problem list section below in epic for further details.  Past Medical History: Past Medical History:  Diagnosis Date  . Hyperlipidemia   . Hypertension    Past Surgical History: Past Surgical History:  Procedure Laterality Date  . CESAREAN SECTION    . CORONARY ARTERY BYPASS GRAFT     Social History: Social History   Socioeconomic History  . Marital status: Married    Spouse name: Not on file  . Number of children: Not on file  . Years of education: Not on file  . Highest education level: Not on file  Occupational History  . Not on file  Social Needs  . Financial resource strain: Not on file  . Food insecurity:    Worry: Not on file    Inability: Not on file  . Transportation needs:    Medical: Not on file    Non-medical: Not on file  Tobacco Use  . Smoking status: Former Research scientist (life sciences)  . Smokeless tobacco: Never Used  Substance and Sexual Activity  . Alcohol use: No    Alcohol/week: 0.0 standard drinks  . Drug use: No  . Sexual activity: Never  Lifestyle  . Physical activity:    Days per week: Not on file    Minutes per session: Not on file  . Stress: Not on file  Relationships  . Social connections:    Talks on phone: Not on file    Gets together: Not on file    Attends religious service: Not on file    Active member of club or organization: Not on file    Attends meetings of clubs or organizations: Not on file    Relationship status: Not on file  Other Topics Concern  . Not on file  Social  History Narrative  . Not on file   Family History: No family history on file. Allergies: Allergies  Allergen Reactions  . Influenza Vaccines    Medications: See med rec.  Review of Systems: No fevers, chills, night sweats, weight loss, chest pain, or shortness of breath.   Objective:    General: Well Developed, well nourished, and in no acute distress.  Neuro: Alert and oriented x3, extra-ocular muscles intact, sensation grossly intact.  HEENT: Normocephalic, atraumatic, pupils equal round reactive to light, neck supple, no masses, no lymphadenopathy, thyroid nonpalpable.  Skin: Warm and dry, no rashes. Cardiac: Regular rate and rhythm, no murmurs rubs or gallops, no lower extremity edema.  Respiratory: Clear to auscultation bilaterally. Not using accessory muscles, speaking in full sentences.  Procedure: Real-time Ultrasound Guided hydrodissection of the right median nerve at the carpal tunnel Device: GE Logiq E  Verbal informed consent obtained.  Time-out conducted.  Noted no overlying erythema, induration, or other signs of local infection.  Skin prepped in a sterile fashion.  Local anesthesia: Topical Ethyl chloride.  With sterile technique and under real time ultrasound guidance: Using a 25-gauge needle advanced into the carpal tunnel, taking care to avoid intraneural injection I injected medication both superficial to  and deep to the median nerve freeing it from surrounding structures, I then redirected the needle deep and injected further medication around the flexor tendons deep within the carpal tunnel for a total of 1 cc kenalog 40, 5 cc lidocaine. Completed without difficulty  Advised to call if fevers/chills, erythema, induration, drainage, or persistent bleeding.  Images permanently stored and available for review in the ultrasound unit.  Impression: Technically successful ultrasound guided median nerve hydrodissection.  Impression and Recommendations:    Carpal  tunnel syndrome Repeat median nerve hydrodissection on the right, previous injection was bilateral and 6+ months ago.  ___________________________________________ [name]. Dianah Field, M.D., ABFM., CAQSM. Primary Care and Sports Medicine [place] [facility]  Adjunct Professor of [place] of [facility] of Medicine

## 2018-09-08 ENCOUNTER — Other Ambulatory Visit: Payer: Self-pay | Admitting: Sports Medicine

## 2018-09-12 ENCOUNTER — Other Ambulatory Visit: Payer: Self-pay | Admitting: Sports Medicine

## 2018-09-12 DIAGNOSIS — E669 Obesity, unspecified: Secondary | ICD-10-CM

## 2018-09-22 ENCOUNTER — Other Ambulatory Visit: Payer: Self-pay | Admitting: Sports Medicine

## 2018-09-22 DIAGNOSIS — H6983 Other specified disorders of Eustachian tube, bilateral: Secondary | ICD-10-CM

## 2018-09-23 ENCOUNTER — Encounter: Payer: Self-pay | Admitting: Sports Medicine

## 2018-09-23 ENCOUNTER — Ambulatory Visit (INDEPENDENT_AMBULATORY_CARE_PROVIDER_SITE_OTHER): Payer: Medicare Other | Admitting: Sports Medicine

## 2018-09-23 ENCOUNTER — Encounter: Payer: Medicare Other | Admitting: Sports Medicine

## 2018-09-23 ENCOUNTER — Ambulatory Visit: Payer: Medicare Other | Admitting: Sports Medicine

## 2018-09-23 DIAGNOSIS — J069 Acute upper respiratory infection, unspecified: Secondary | ICD-10-CM | POA: Insufficient documentation

## 2018-09-23 NOTE — Patient Instructions (Signed)
Viral Respiratory Infection A respiratory infection is an illness that affects part of the respiratory system, such as the lungs, nose, or throat. A respiratory infection that is caused by a virus is called a viral respiratory infection. Common types of viral respiratory infections include:  A cold.  The flu (influenza).  A respiratory syncytial virus (RSV) infection. What are the causes? This condition is caused by a virus. What are the signs or symptoms? Symptoms of this condition include:  A stuffy or runny nose.  Yellow or green nasal discharge.  A cough.  Sneezing.  Fatigue.  Achy muscles.  A sore throat.  Sweating or chills.  A fever.  A headache. How is this diagnosed? This condition may be diagnosed based on:  Your symptoms.  A physical exam.  Testing of nasal swabs. How is this treated? This condition may be treated with medicines, such as:  Antiviral medicine. This may shorten the length of time a person has symptoms.  Expectorants. These make it easier to cough up mucus.  Decongestant nasal sprays.  Acetaminophen or NSAIDs to relieve fever and pain. Antibiotic medicines are not prescribed for viral infections. This is because antibiotics are designed to kill bacteria. They are not effective against viruses. Follow these instructions at home:  Managing pain and congestion  Take over-the-counter and prescription medicines only as told by your health care provider.  If you have a sore throat, gargle with a salt-water mixture 3-4 times a day or as needed. To make a salt-water mixture, completely dissolve -1 tsp of salt in 1 cup of warm water.  Use nose drops made from salt water to ease congestion and soften raw skin around your nose.  Drink enough fluid to keep your urine pale yellow. This helps prevent dehydration and helps loosen up mucus. General instructions  Rest as much as possible.  Do not drink alcohol.  Do not use any products  that contain nicotine or tobacco, such as cigarettes and e-cigarettes. If you need help quitting, ask your health care provider.  Keep all follow-up visits as told by your health care provider. This is important. How is this prevented?   Get an annual flu shot. You may get the flu shot in late summer, fall, or winter. Ask your health care provider when you should get your flu shot.  Avoid exposing others to your respiratory infection. ? Stay home from work or school as told by your health care provider. ? Wash your hands with soap and water often, especially after you cough or sneeze. If soap and water are not available, use alcohol-based hand sanitizer.  Avoid contact with people who are sick during cold and flu season. This is generally fall and winter. Contact a health care provider if:  Your symptoms last for 10 days or longer.  Your symptoms get worse over time.  You have a fever.  You have severe sinus pain in your face or forehead.  The glands in your jaw or neck become very swollen. Get help right away if you:  Feel pain or pressure in your chest.  Have shortness of breath.  Faint or feel like you will faint.  Have severe and persistent vomiting.  Feel confused or disoriented. Summary  A respiratory infection is an illness that affects part of the respiratory system, such as the lungs, nose, or throat. A respiratory infection that is caused by a virus is called a viral respiratory infection.  Common types of viral respiratory infections are a   you will faint.  · Have severe and persistent vomiting.  · Feel confused or disoriented.  Summary  · A respiratory infection is an illness that affects part of the respiratory system, such as the lungs, nose, or throat. A respiratory infection that is caused by a virus is called a viral respiratory infection.  · Common types of viral respiratory infections are a cold, influenza, and respiratory syncytial virus (RSV) infection.  · Symptoms of this condition include a stuffy or runny nose, cough, sneezing, fatigue, achy muscles, sore throat, and fevers or chills.  · Antibiotic medicines are not prescribed for viral infections. This is because antibiotics are designed to kill bacteria. They are not effective against viruses.  This information is not intended to replace advice given to you by  your health care provider. Make sure you discuss any questions you have with your health care provider.  Document Released: 04/26/2005 Document Revised: 08/27/2017 Document Reviewed: 08/27/2017  Elsevier Interactive Patient Education © 2019 [employer] Inc[employer]

## 2018-09-23 NOTE — Progress Notes (Signed)
  Subjective:    CC: Feeling sick  HPI: 1 day history of fatigue, sore throat, cough.  Congestion noted, no chest pain, no shortness of breath, no skin rash.  Symptoms are moderate, worsening.  I reviewed the past medical history, family history, social history, surgical history, and allergies today and no changes were needed.  Please see the problem list section below in epic for further details.  Past Medical History: Past Medical History:  Diagnosis Date  . Hyperlipidemia   . Hypertension    Past Surgical History: Past Surgical History:  Procedure Laterality Date  . CESAREAN SECTION    . CORONARY ARTERY BYPASS GRAFT     Social History: Social History   Socioeconomic History  . Marital status: Married    Spouse name: Not on file  . Number of children: Not on file  . Years of education: Not on file  . Highest education level: Not on file  Occupational History  . Not on file  Social Needs  . Financial resource strain: Not on file  . Food insecurity:    Worry: Not on file    Inability: Not on file  . Transportation needs:    Medical: Not on file    Non-medical: Not on file  Tobacco Use  . Smoking status: Former Research scientist (life sciences)  . Smokeless tobacco: Never Used  Substance and Sexual Activity  . Alcohol use: No    Alcohol/week: 0.0 standard drinks  . Drug use: No  . Sexual activity: Never  Lifestyle  . Physical activity:    Days per week: Not on file    Minutes per session: Not on file  . Stress: Not on file  Relationships  . Social connections:    Talks on phone: Not on file    Gets together: Not on file    Attends religious service: Not on file    Active member of club or organization: Not on file    Attends meetings of clubs or organizations: Not on file    Relationship status: Not on file  Other Topics Concern  . Not on file  Social History Narrative  . Not on file   Family History: No family history on file. Allergies: Allergies  Allergen Reactions  .  Influenza Vaccines    Medications: See med rec.  Review of Systems: No fevers, chills, night sweats, weight loss, chest pain, or shortness of breath.   Objective:    General: Well Developed, well nourished, and in no acute distress.  Neuro: Alert and oriented x3, extra-ocular muscles intact, sensation grossly intact.  HEENT: Normocephalic, atraumatic, pupils equal round reactive to light, neck supple, no masses, no lymphadenopathy, thyroid nonpalpable.  Oropharynx, nasopharynx, ear canals unremarkable. Skin: Warm and dry, no rashes. Cardiac: Regular rate and rhythm, no murmurs rubs or gallops, no lower extremity edema.  Respiratory: Clear to auscultation bilaterally. Not using accessory muscles, speaking in full sentences.  Rapid flu test is negative  Impression and Recommendations:    Viral upper respiratory infection Tracey Marshall has a cold. Over-the-counter cold and flu medications, hydration, relative rest. Return as needed. ___________________________________________ [name]. Dianah Field, M.D., ABFM., CAQSM. Primary Care and Sports Medicine  [facility]  Adjunct Professor of [place] of [facility] of Medicine

## 2018-09-23 NOTE — Assessment & Plan Note (Signed)
Tracey Marshall has a cold. Over-the-counter cold and flu medications, hydration, relative rest. Return as needed.

## 2018-09-27 ENCOUNTER — Ambulatory Visit (INDEPENDENT_AMBULATORY_CARE_PROVIDER_SITE_OTHER): Payer: Medicare Other | Admitting: Sports Medicine

## 2018-09-27 ENCOUNTER — Ambulatory Visit (INDEPENDENT_AMBULATORY_CARE_PROVIDER_SITE_OTHER): Payer: Medicare Other

## 2018-09-27 VITALS — BP 146/73 | HR 77 | Ht <= 58 in | Wt 153.0 lb

## 2018-09-27 DIAGNOSIS — I10 Essential (primary) hypertension: Secondary | ICD-10-CM

## 2018-09-27 DIAGNOSIS — E785 Hyperlipidemia, unspecified: Secondary | ICD-10-CM

## 2018-09-27 DIAGNOSIS — M25521 Pain in right elbow: Secondary | ICD-10-CM

## 2018-09-27 DIAGNOSIS — E669 Obesity, unspecified: Secondary | ICD-10-CM

## 2018-09-27 DIAGNOSIS — E1169 Type 2 diabetes mellitus with other specified complication: Secondary | ICD-10-CM | POA: Diagnosis not present

## 2018-09-27 DIAGNOSIS — Z0000 Encounter for general adult medical examination without abnormal findings: Secondary | ICD-10-CM | POA: Diagnosis not present

## 2018-09-27 MED ORDER — MELOXICAM 15 MG PO TABS: ORAL_TABLET | ORAL | 3 refills | Status: DC

## 2018-09-27 NOTE — Progress Notes (Signed)
Subjective:   Tracey Marshall is a 68 y.o. female who presents for Medicare Annual (Subsequent) preventive examination.  Review of Systems:  Comprehensive review of systems is negative except as noted above.    Objective:     Vitals: BP (!) 146/73   Pulse 77   Ht 4' 9.5" (1.461 m)   Wt 153 lb (69.4 kg)   BMI 32.54 kg/m   Body mass index is 32.54 kg/m.  Advanced Directives 06/11/2015  Does Patient Have a Medical Advance Directive? No  Would patient like information on creating a medical advance directive? No - patient declined information    Tobacco Social History   Tobacco Use  Smoking Status Former Smoker  Smokeless Tobacco Never Used     Counseling given: Not Answered   Clinical Intake:  This is a pleasant 68 year old female here for her physical.  Hypertension: Well-controlled, stable.  Hyperlipidemia: Well controlled and stable.  Diabetes mellitus type 2: Well-controlled and stable, no changes.  Osteoporosis: Stable on alendronate.  Most recent bone density test was in June 2019.  Right elbow pain: Localized just distal to the medial condyle, present for a week now.  No trauma, no change in activities.  Nothing radicular.  Past Medical History:  Diagnosis Date  . Hyperlipidemia   . Hypertension    Past Surgical History:  Procedure Laterality Date  . CESAREAN SECTION    . CORONARY ARTERY BYPASS GRAFT     No family history on file. Social History   Socioeconomic History  . Marital status: Married    Spouse name: Not on file  . Number of children: Not on file  . Years of education: Not on file  . Highest education level: Not on file  Occupational History  . Not on file  Social Needs  . Financial resource strain: Not on file  . Food insecurity:    Worry: Not on file    Inability: Not on file  . Transportation needs:    Medical: Not on file    Non-medical: Not on file  Tobacco Use  . Smoking status: Former Research scientist (life sciences)  . Smokeless tobacco:  Never Used  Substance and Sexual Activity  . Alcohol use: No    Alcohol/week: 0.0 standard drinks  . Drug use: No  . Sexual activity: Never  Lifestyle  . Physical activity:    Days per week: Not on file    Minutes per session: Not on file  . Stress: Not on file  Relationships  . Social connections:    Talks on phone: Not on file    Gets together: Not on file    Attends religious service: Not on file    Active member of club or organization: Not on file    Attends meetings of clubs or organizations: Not on file    Relationship status: Not on file  Other Topics Concern  . Not on file  Social History Narrative  . Not on file    Outpatient Encounter Medications as of 09/27/2018  Medication Sig  . alendronate (FOSAMAX) 70 MG tablet TAKE 1 TABLET BY MOUTH  EVERY 7 DAYS  . aspirin EC 81 MG tablet Take 1 tablet (81 mg total) by mouth daily.  Marland Kitchen atorvastatin (LIPITOR) 80 MG tablet TAKE 1 TABLET BY MOUTH  DAILY  . carvedilol (COREG) 3.125 MG tablet Take 1 tablet (3.125 mg total) by mouth 2 (two) times daily with a meal.  . fluticasone (FLONASE) 50 MCG/ACT nasal spray USE 1  SPRAY IN EACH NOSTRIL 2 TIMES A DAY USE LEFT HAND FOR RIGHT NOSTRIL AND RIGHT HAND FOR LEFT NOSTRIL.  Marland Kitchen letrozole (FEMARA) 2.5 MG tablet Take 2.5 mg by mouth at bedtime.  Marland Kitchen lisinopril (PRINIVIL,ZESTRIL) 40 MG tablet Take 1 tablet (40 mg total) by mouth daily.  . metFORMIN (GLUCOPHAGE) 1000 MG tablet TAKE 1 TABLET BY MOUTH TWO  TIMES DAILY WITH MEALS  . omeprazole (PRILOSEC) 20 MG capsule TAKE 1 CAPSULE BY MOUTH  DAILY  . topiramate (TOPAMAX) 50 MG tablet TAKE 1 TABLET BY MOUTH  DAILY   No facility-administered encounter medications on file as of 09/27/2018.     Activities of Daily Living No flowsheet data found.  Patient Care Team: Silverio Decamp, MD as PCP - General (Family Medicine)    Assessment:   This is a routine wellness examination for Tracey Marshall.  Exercise Activities and Dietary recommendations     Goals   None     Fall Risk No flowsheet data found. Is the patient's home free of loose throw rugs in walkways, pet beds, electrical cords, etc?   yes      Grab bars in the bathroom? no      Handrails on the stairs?   yes      Adequate lighting?   yes  Timed Get Up and Go performed: No needed  Depression Screen PHQ 2/9 Scores 01/23/2018 12/12/2016  PHQ - 2 Score 4 0  PHQ- 9 Score 7 0     Cognitive Function        Immunization History  Administered Date(s) Administered  . PPD Test 10/31/2016  . Pneumococcal Conjugate-13 06/10/2015  . Pneumococcal Polysaccharide-23 12/12/2016  . Tdap 06/10/2015    Qualifies for Shingles Vaccine? Not needed   Screening Tests Health Maintenance  Topic Date Due  . OPHTHALMOLOGY EXAM  11/28/2017  . INFLUENZA VACCINE  02/28/2018  . HEMOGLOBIN A1C  07/25/2018  . FOOT EXAM  01/24/2019  . MAMMOGRAM  12/05/2019  . TETANUS/TDAP  06/09/2025  . COLONOSCOPY  08/30/2025  . DEXA SCAN  Completed  . Hepatitis C Screening  Completed  . PNA vac Low Risk Adult  Completed    Cancer Screenings: Lung: Low Dose CT Chest recommended if Age 82-80 years, 30 pack-year currently smoking OR have quit w/in 15years. Patient does not qualify. Breast:  Up to date on Mammogram? Yes   Up to date of Bone Density/Dexa? Yes Colorectal: UTD  Additional Screenings: Hepatitis C Screening: Done.  General: Well Developed, well nourished, and in no acute distress.  Neuro: Alert and oriented x3, extra-ocular muscles intact, sensation grossly intact. Cranial nerves II through XII are intact, motor, sensory, and coordinative functions are all intact. HEENT: Normocephalic, atraumatic, pupils equal round reactive to light, neck supple, no masses, no lymphadenopathy, thyroid nonpalpable. Oropharynx, nasopharynx, external ear canals are unremarkable. Skin: Warm and dry, no rashes noted.  Cardiac: Regular rate and rhythm, no murmurs rubs or gallops.  Respiratory: Clear to  auscultation bilaterally. Not using accessory muscles, speaking in full sentences.  Abdominal: Soft, nontender, nondistended, positive bowel sounds, no masses, no organomegaly.  Right elbow: Unremarkable to inspection. Range of motion full pronation, supination, flexion, extension. Strength is full to all of the above directions Stable to varus, valgus stress. Negative moving valgus stress test. Tender to palpation just distal to the medial condyle, unable to reproduce it with any resisted movement other than direct palpation. Ulnar nerve does not sublux. Negative cubital tunnel Tinel's.    Plan:  Annual physical exam Annual physical exam as above.  Diabetes mellitus type 2 in obese [facility]) Historically stable and well-controlled, rechecking labs today.  Essential hypertension, benign Well-controlled, stable.  Hyperlipidemia Well-controlled, stable.  Right elbow pain Pain just distal to the medial epicondyle. Medial epicondylitis rehab exercises given, exercises, may use meloxicam. Return to see me in 2 to 4 weeks, MRI if no better.  Obesity We did talk extensively about her weight. Currently on metformin and Topamax. I offered weight watchers, nutritionist, she declines.    I have personally reviewed and noted the following in the patient's chart:   . Medical and social history . Use of alcohol, tobacco or illicit drugs  . Current medications and supplements . Functional ability and status . Nutritional status . Physical activity . Advanced directives . List of other physicians . Hospitalizations, surgeries, and ER visits in previous 12 months . Vitals . Screenings to include cognitive, depression, and falls . Referrals and appointments  In addition, I have reviewed and discussed with patient certain preventive protocols, quality metrics, and best practice recommendations. A written personalized care plan for preventive services as well as general preventive health  recommendations were provided to patient.     Aundria Mems, MD  09/27/2018

## 2018-09-27 NOTE — Assessment & Plan Note (Signed)
Pain just distal to the medial epicondyle. Medial epicondylitis rehab exercises given, exercises, may use meloxicam. Return to see me in 2 to 4 weeks, MRI if no better.

## 2018-09-27 NOTE — Assessment & Plan Note (Signed)
Annual physical exam as above. 

## 2018-09-27 NOTE — Assessment & Plan Note (Signed)
Well-controlled, stable.

## 2018-09-27 NOTE — Assessment & Plan Note (Signed)
We did talk extensively about her weight. Currently on metformin and Topamax. I offered weight watchers, nutritionist, she declines.

## 2018-09-27 NOTE — Assessment & Plan Note (Signed)
Historically stable and well-controlled, rechecking labs today.

## 2018-09-28 LAB — COMPREHENSIVE METABOLIC PANEL
AG Ratio: 1.8 (calc) (ref 1.0–2.5)
ALT: 28 U/L (ref 6–29)
AST: 20 U/L (ref 10–35)
Albumin: 4.4 g/dL (ref 3.6–5.1)
Alkaline phosphatase (APISO): 67 U/L (ref 37–153)
BUN/Creatinine Ratio: 26 (calc) — ABNORMAL HIGH (ref 6–22)
BUN: 27 mg/dL — ABNORMAL HIGH (ref 7–25)
CO2: 26 mmol/L (ref 20–32)
Calcium: 10.1 mg/dL (ref 8.6–10.4)
Chloride: 109 mmol/L (ref 98–110)
Creat: 1.04 mg/dL — ABNORMAL HIGH (ref 0.50–0.99)
Globulin: 2.5 g/dL (calc) (ref 1.9–3.7)
Glucose, Bld: 113 mg/dL — ABNORMAL HIGH (ref 65–99)
Potassium: 5.2 mmol/L (ref 3.5–5.3)
Sodium: 143 mmol/L (ref 135–146)
Total Bilirubin: 0.5 mg/dL (ref 0.2–1.2)
Total Protein: 6.9 g/dL (ref 6.1–8.1)

## 2018-09-28 LAB — CBC
HCT: 40.1 % (ref 35.0–45.0)
Hemoglobin: 13.6 g/dL (ref 11.7–15.5)
MCH: 31.3 pg (ref 27.0–33.0)
MCHC: 33.9 g/dL (ref 32.0–36.0)
MCV: 92.2 fL (ref 80.0–100.0)
MPV: 10.2 fL (ref 7.5–12.5)
Platelets: 294 10*3/uL (ref 140–400)
RBC: 4.35 10*6/uL (ref 3.80–5.10)
RDW: 12.5 % (ref 11.0–15.0)
WBC: 8.7 10*3/uL (ref 3.8–10.8)

## 2018-09-28 LAB — HEMOGLOBIN A1C
Hgb A1c MFr Bld: 6.1 % of total Hgb — ABNORMAL HIGH (ref <5.7)
Mean Plasma Glucose: 128 (calc)
eAG (mmol/L): 7.1 (calc)

## 2018-09-28 LAB — LIPID PANEL W/REFLEX DIRECT LDL
Cholesterol: 148 mg/dL (ref <200)
HDL: 48 mg/dL — ABNORMAL LOW (ref >=50)
LDL Cholesterol (Calc): 79 mg/dL (calc)
Non-HDL Cholesterol (Calc): 100 mg/dL (calc) (ref <130)
Total CHOL/HDL Ratio: 3.1 (calc) (ref <5.0)
Triglycerides: 109 mg/dL (ref <150)

## 2018-09-28 LAB — TSH: TSH: 1.69 mIU/L (ref 0.40–4.50)

## 2018-10-18 ENCOUNTER — Encounter: Payer: Self-pay | Admitting: Sports Medicine

## 2018-10-18 ENCOUNTER — Ambulatory Visit (INDEPENDENT_AMBULATORY_CARE_PROVIDER_SITE_OTHER): Payer: Medicare Other | Admitting: Sports Medicine

## 2018-10-18 ENCOUNTER — Other Ambulatory Visit: Payer: Self-pay

## 2018-10-18 DIAGNOSIS — H1013 Acute atopic conjunctivitis, bilateral: Secondary | ICD-10-CM | POA: Diagnosis not present

## 2018-10-18 DIAGNOSIS — M25521 Pain in right elbow: Secondary | ICD-10-CM | POA: Diagnosis not present

## 2018-10-18 DIAGNOSIS — H1010 Acute atopic conjunctivitis, unspecified eye: Secondary | ICD-10-CM | POA: Insufficient documentation

## 2018-10-18 DIAGNOSIS — G5602 Carpal tunnel syndrome, left upper limb: Secondary | ICD-10-CM

## 2018-10-18 MED ORDER — AZELASTINE HCL 0.05 % OP SOLN: 2.0000 [drp] | Freq: Two times a day (BID) | OPHTHALMIC | 11 refills | Status: DC

## 2018-10-18 NOTE — Assessment & Plan Note (Signed)
Adding Optivar.

## 2018-10-18 NOTE — Progress Notes (Signed)
Subjective:    CC: Left wrist pain  HPI: Tracey Marshall returns, he is a pleasant 68 year old female, she is having a recurrence of left-sided wrist pain, swelling, with numbness and tingling at night.  She has had a Hydro dissection in the past, about a year ago.  In addition she has noted some itchy, scratchy eyes with excessive watering currently as allergy season comes into full swing.  No visual changes, no trauma, no constitutional symptoms.  Her elbow is significantly better, it only hurts when she presses on it and she agrees to stop pressing on it.  I reviewed the past medical history, family history, social history, surgical history, and allergies today and no changes were needed.  Please see the problem list section below in epic for further details.  Past Medical History: Past Medical History:  Diagnosis Date  . Hyperlipidemia   . Hypertension    Past Surgical History: Past Surgical History:  Procedure Laterality Date  . CESAREAN SECTION    . CORONARY ARTERY BYPASS GRAFT     Social History: Social History   Socioeconomic History  . Marital status: Married    Spouse name: Not on file  . Number of children: Not on file  . Years of education: Not on file  . Highest education level: Not on file  Occupational History  . Not on file  Social Needs  . Financial resource strain: Not on file  . Food insecurity:    Worry: Not on file    Inability: Not on file  . Transportation needs:    Medical: Not on file    Non-medical: Not on file  Tobacco Use  . Smoking status: Former Research scientist (life sciences)  . Smokeless tobacco: Never Used  Substance and Sexual Activity  . Alcohol use: No    Alcohol/week: 0.0 standard drinks  . Drug use: No  . Sexual activity: Never  Lifestyle  . Physical activity:    Days per week: Not on file    Minutes per session: Not on file  . Stress: Not on file  Relationships  . Social connections:    Talks on phone: Not on file    Gets together: Not on file   Attends religious service: Not on file    Active member of club or organization: Not on file    Attends meetings of clubs or organizations: Not on file    Relationship status: Not on file  Other Topics Concern  . Not on file  Social History Narrative  . Not on file   Family History: No family history on file. Allergies: Allergies  Allergen Reactions  . Influenza Vaccines    Medications: See med rec.  Review of Systems: No fevers, chills, night sweats, weight loss, chest pain, or shortness of breath.   Objective:    General: Well Developed, well nourished, and in no acute distress.  Neuro: Alert and oriented x3, extra-ocular muscles intact, sensation grossly intact.  HEENT: Normocephalic, atraumatic, pupils equal round reactive to light, neck supple, no masses, no lymphadenopathy, thyroid nonpalpable.  Skin: Warm and dry, no rashes. Cardiac: Regular rate and rhythm, no murmurs rubs or gallops, no lower extremity edema.  Respiratory: Clear to auscultation bilaterally. Not using accessory muscles, speaking in full sentences.  Procedure: Real-time Ultrasound Guided hydrodissection of the left median nerve at the carpal tunnel Device: GE Logiq E  Verbal informed consent obtained.  Time-out conducted.  Noted no overlying erythema, induration, or other signs of local infection.  Skin prepped in a  sterile fashion.  Local anesthesia: Topical Ethyl chloride.  With sterile technique and under real time ultrasound guidance: Using a 25-gauge needle advanced into the carpal tunnel, taking care to avoid intraneural injection I injected medication both superficial to and deep to the median nerve freeing it from surrounding structures, I then redirected the needle deep and injected further medication around the flexor tendons deep within the carpal tunnel for a total of 1 cc kenalog 40, 5 cc lidocaine. Completed without difficulty  Advised to call if fevers/chills, erythema, induration,  drainage, or persistent bleeding.  Images permanently stored and available for review in the ultrasound unit.  Impression: Technically successful ultrasound guided median nerve hydrodissection.  Impression and Recommendations:    Carpal tunnel syndrome Previous bilateral injection was a year ago. We did a right-sided Hydro dissection in November 2019. The left-sided Hydro dissection today.   Right elbow pain Medial epicondylitis versus distal biceps tendinitis, at this point pain is for the most part gone. It only hurts when she presses on it, she will get a little more diligent with her rehab exercises, but I do not think we need to intervene any further. It is not interfering with her life or activities of daily living.  Allergic conjunctivitis Adding Optivar.    ___________________________________________ [name]. Dianah Field, M.D., ABFM., CAQSM. Primary Care and Sports Medicine [facility]  Adjunct Professor of [employer]

## 2018-10-18 NOTE — Assessment & Plan Note (Signed)
Medial epicondylitis versus distal biceps tendinitis, at this point pain is for the most part gone. It only hurts when she presses on it, she will get a little more diligent with her rehab exercises, but I do not think we need to intervene any further. It is not interfering with her life or activities of daily living.

## 2018-10-18 NOTE — Patient Instructions (Signed)
Allergic Conjunctivitis, Adult    Allergic conjunctivitis is inflammation of the clear membrane that covers the white part of your eye and the inner surface of your eyelid (conjunctiva). The inflammation is caused by allergies. The blood vessels in the conjunctiva become inflamed and this causes the eyes to become red or pink. The eyes often feel itchy. Allergic conjunctivitis cannot be spread from one person to another person (is not contagious).  What are the causes?  This condition is caused by an allergic reaction. Common causes of an allergic reaction (allergens) include:   Outdoor allergens, such as:  ? Pollen.  ? Grass and weeds.  ? Mold spores.   Indoor allergens, such as:  ? Dust.  ? Smoke.  ? Mold.  ? Pet dander.  ? Animal hair.  What increases the risk?  You may be more likely to develop this condition if you have a family history of allergies, such as:   Allergic rhinitis.   Bronchial asthma.   Atopic dermatitis.  What are the signs or symptoms?  Symptoms of this condition include eyes that are:   Itchy.   Red.   Watery.   Puffy.  Your eyes may also:   Sting or burn.   Have clear drainage coming from them.  How is this diagnosed?  This condition may be diagnosed by medical history and physical exam. If you have drainage from your eyes, it may be tested to rule out other causes of conjunctivitis. You may also need to see a health care provider who specializes in treating allergies (allergist) or eye conditions (ophthalmologist) for tests to confirm the diagnosis. You may have:   Skin tests to see which allergens are causing your symptoms. These tests involve pricking the skin with a tiny needle and exposing the skin to small amounts of potential allergens to see if your skin reacts.   Blood tests.   Tissue scrapings from your eyelid. These will be examined under a microscope.  How is this treated?  Treatments for this condition may include:   Cold cloths (compresses) to soothe itching and  swelling.   Washing the face to remove allergens.   Eye drops. These may be prescription or over-the-counter. There are several different types. You may need to try different types to see which one works best for you. Your may need:  ? Eye drops that block the allergic reaction (antihistamine).  ? Eye drops that reduce swelling and irritation (anti-inflammatory).  ? Steroid eye drops to lessen a severe reaction (vernal conjunctivitis).   Oral antihistamine medicines to reduce your allergic reaction. You may need these if eye drops do not help or are difficult to use.  Follow these instructions at home:   Avoid known allergens whenever possible.   Take or apply over-the-counter and prescription medicines only as told by your health care provider. These include any eye drops.   Apply a cool, clean washcloth to your eye for 10-20 minutes, 3-4 times a day.   Do not touch or rub your eyes.   Do not wear contact lenses until the inflammation is gone. Wear glasses instead.   Do not wear eye makeup until the inflammation is gone.   Keep all follow-up visits as told by your health care provider. This is important.  Contact a health care provider if:   Your symptoms get worse or do not improve with treatment.   You have mild eye pain.   You have sensitivity to light.   You   have spots or blisters on your eyes.   You have pus draining from your eye.   You have a fever.  Get help right away if:   You have redness, swelling, or other symptoms in only one eye.   Your vision is blurred or you have vision changes.   You have severe eye pain.  This information is not intended to replace advice given to you by your health care provider. Make sure you discuss any questions you have with your health care provider.  Document Released: 10/07/2002 Document Revised: 03/15/2016 Document Reviewed: 01/28/2016  Elsevier Interactive Patient Education  2019 Elsevier Inc[employer]

## 2018-10-18 NOTE — Assessment & Plan Note (Signed)
Previous bilateral injection was a year ago. We did a right-sided Hydro dissection in November 2019. The left-sided Hydro dissection today.

## 2018-10-22 ENCOUNTER — Other Ambulatory Visit: Payer: Self-pay

## 2018-10-22 ENCOUNTER — Encounter: Payer: Self-pay | Admitting: Sports Medicine

## 2018-10-22 ENCOUNTER — Telehealth: Payer: Self-pay

## 2018-10-22 ENCOUNTER — Ambulatory Visit (INDEPENDENT_AMBULATORY_CARE_PROVIDER_SITE_OTHER): Payer: Medicare Other | Admitting: Sports Medicine

## 2018-10-22 DIAGNOSIS — B009 Herpesviral infection, unspecified: Secondary | ICD-10-CM | POA: Diagnosis not present

## 2018-10-22 DIAGNOSIS — B002 Herpesviral gingivostomatitis and pharyngotonsillitis: Secondary | ICD-10-CM | POA: Insufficient documentation

## 2018-10-22 MED ORDER — VALACYCLOVIR HCL 1 G PO TABS: 1000.0000 mg | ORAL_TABLET | Freq: Two times a day (BID) | ORAL | 2 refills | Status: DC

## 2018-10-22 MED ORDER — LIDOCAINE VISCOUS HCL 2 % MT SOLN: 15.0000 mL | OROMUCOSAL | 0 refills | Status: DC | PRN

## 2018-10-22 NOTE — Telephone Encounter (Signed)
Patient scheduled for visit for mouth soreness.

## 2018-10-22 NOTE — Assessment & Plan Note (Signed)
Adding Valtrex, oral viscous lidocaine.

## 2018-10-22 NOTE — Patient Instructions (Signed)
Cold Sore    A cold sore, also called a fever blister, is a small, fluid-filled sore that forms inside the mouth or on the lips, gums, nose, chin, or cheeks. Cold sores can spread to other parts of the body, such as the eyes or fingers. In some people who have other medical conditions, cold sores can spread to multiple other body sites, including the genitals.  Cold sores can spread from person to person (are contagious) until the sores crust over completely. Most cold sores go away within 2 weeks.  What are the causes?  Cold sores are caused by an infection from a common type of herpes simplex virus (HSV-1). HSV-1 is closely related to the HSV-2virus, which is the virus that causes genital herpes, but these viruses are not the same. Once a person is infected with HSV-1, the virus remains permanently in the body.  HSV-1 is spread from person to person through close contact, such as through kissing, touching the affected area, or sharing personal items such as lip balm, razors, a drinking glass, or eating utensils.  What increases the risk?  You are more likely to develop this condition if you:  · Are tired, stressed, or sick.  · Are menstruating.  · Are pregnant.  · Take certain medicines.  · Are exposed to cold weather or too much sun.  What are the signs or symptoms?  Symptoms of a cold sore outbreak go through different stages. These are the stages of a cold sore:  · Tingling, itching, or burning is felt 1-2 days before the outbreak.  · Fluid-filled blisters appear on the lips, inside the mouth, on the nose, or on the cheeks.  · The blisters start to ooze clear fluid.  · The blisters dry up, and a yellow crust appears in their place.  · The crust falls off.  In some cases, other symptoms can develop during a cold sore outbreak. These can include:  · Fever.  · Sore throat.  · Headache.  · Muscle aches.  · Swollen neck glands.  How is this diagnosed?  This condition is diagnosed based on your medical history and a  physical exam. Your health care provider may do a blood test or may swab some fluid from your sore and then examine the swab in the lab.  How is this treated?  There is no cure for cold sores or HSV-1. There is also no vaccine for HSV-1. Most cold sores go away on their own without treatment within 2 weeks. Medicines cannot make the infection go away, but your health care provider may prescribe medicines to:  · Help relieve some of the pain associated with the sores.  · Work to stop the virus from multiplying.  · Shorten healing time.  Medicines may be in the form of creams, gels, pills, or a shot.  Follow these instructions at home:  Medicines  · Take or apply over-the-counter and prescription medicines only as told by your health care provider.  · Use a cotton-tip swab to apply creams or gels to your sores.  · Ask your health care provider if you can take lysine supplements. Research has found that lysine may help heal the cold sore faster and prevent outbreaks.  Sore care    · Do not touch the sores or pick the scabs.  · Wash your hands often. Do not touch your eyes without washing your hands first.  · Keep the sores clean and dry.  · If directed,   apply ice to the sores:  ? Put ice in a plastic bag.  ? Place a towel between your skin and the bag.  ? Leave the ice on for 20 minutes, 2-3 times a day.  Eating and drinking  · Eat a soft, bland diet. Avoid eating hot, cold, or salty foods.  · Use a straw if it hurts to drink out of a glass.  · Eat foods that are rich in lysine, such as meat, fish, and dairy products.  · Avoid sugary foods, chocolates, nuts, and grains. These foods are rich in a nutrient called arginine, which can cause the virus to multiply.  Lifestyle  · Do not kiss, have oral sex, or share personal items until your sores heal.  · Stress, poor sleep, and being out in the sun can trigger outbreaks. Make sure you:  ? Do activities that help you relax, such as deep breathing exercises or  meditation.  ? Get enough sleep.  ? Apply sunscreen on your lips before you go out in the sun.  Contact a health care provider if:  · You have symptoms for more than 2 weeks.  · You have pus coming from the sores.  · You have redness that is spreading.  · You have pain or irritation in your eye.  · You get sores on your genitals.  · Your sores do not heal within 2 weeks.  · You have frequent cold sore outbreaks.  Get help right away if you have:  · A fever and your symptoms suddenly get worse.  · A headache and confusion.  · Fatigue or loss of appetite.  · A stiff neck or sensitivity to light.  Summary  · A cold sore, also called a fever blister, is a small, fluid-filled sore that forms inside the mouth or on the lips, gums, nose, chin, or cheeks.  · Most cold sores go away on their own without treatment within 2 weeks. Your health care provider may prescribe medicines to help relieve some of the pain, work to stop the virus from multiplying, and shorten healing time.  · Wash your hands often. Do not touch your eyes without washing your hands first.  · Do not kiss, have oral sex, or share personal items until your sores heal.  · Contact a health care provider if your sores do not heal within 2 weeks.  This information is not intended to replace advice given to you by your health care provider. Make sure you discuss any questions you have with your health care provider.  Document Released: 07/14/2000 Document Revised: 12/17/2017 Document Reviewed: 12/17/2017  Elsevier Interactive Patient Education © 2019 Elsevier Inc.

## 2018-10-22 NOTE — Progress Notes (Signed)
  Subjective:    CC: Oral soreness  HPI: Tracey Marshall is a pleasant 68 year old female, for the past 2 days she had a sore on her hard palate.  She tried rubbing some salt on it which did not help.  Symptoms are moderate, persistent, localized without radiation, no upper respiratory symptoms, no constitutional symptoms.  I reviewed the past medical history, family history, social history, surgical history, and allergies today and no changes were needed.  Please see the problem list section below in epic for further details.  Past Medical History: Past Medical History:  Diagnosis Date  . Hyperlipidemia   . Hypertension    Past Surgical History: Past Surgical History:  Procedure Laterality Date  . CESAREAN SECTION    . CORONARY ARTERY BYPASS GRAFT     Social History: Social History   Socioeconomic History  . Marital status: Married    Spouse name: Not on file  . Number of children: Not on file  . Years of education: Not on file  . Highest education level: Not on file  Occupational History  . Not on file  Social Needs  . Financial resource strain: Not on file  . Food insecurity:    Worry: Not on file    Inability: Not on file  . Transportation needs:    Medical: Not on file    Non-medical: Not on file  Tobacco Use  . Smoking status: Former Research scientist (life sciences)  . Smokeless tobacco: Never Used  Substance and Sexual Activity  . Alcohol use: No    Alcohol/week: 0.0 standard drinks  . Drug use: No  . Sexual activity: Never  Lifestyle  . Physical activity:    Days per week: Not on file    Minutes per session: Not on file  . Stress: Not on file  Relationships  . Social connections:    Talks on phone: Not on file    Gets together: Not on file    Attends religious service: Not on file    Active member of club or organization: Not on file    Attends meetings of clubs or organizations: Not on file    Relationship status: Not on file  Other Topics Concern  . Not on file  Social History  Narrative  . Not on file   Family History: No family history on file. Allergies: Allergies  Allergen Reactions  . Influenza Vaccines    Medications: See med rec.  Review of Systems: No fevers, chills, night sweats, weight loss, chest pain, or shortness of breath.   Objective:    General: Well Developed, well nourished, and in no acute distress.  Neuro: Alert and oriented x3, extra-ocular muscles intact, sensation grossly intact.  HEENT: Normocephalic, atraumatic, pupils equal round reactive to light, neck supple, no masses, no lymphadenopathy, thyroid nonpalpable.  Please see picture below for description oral rash. Skin: Warm and dry, no rashes. Cardiac: Regular rate and rhythm, no murmurs rubs or gallops, no lower extremity edema.  Respiratory: Clear to auscultation bilaterally. Not using accessory muscles, speaking in full sentences.    Impression and Recommendations:    Herpes virus infection of oral mucosa Adding Valtrex, oral viscous lidocaine.   ___________________________________________ [name]. Dianah Field, M.D., ABFM., CAQSM. Primary Care and Sports Medicine [facility]  Adjunct Professor of [place] [employer] [facility] [employer]

## 2018-11-23 ENCOUNTER — Other Ambulatory Visit: Payer: Self-pay | Admitting: Sports Medicine

## 2018-11-23 DIAGNOSIS — E669 Obesity, unspecified: Principal | ICD-10-CM

## 2018-11-23 DIAGNOSIS — E1169 Type 2 diabetes mellitus with other specified complication: Secondary | ICD-10-CM

## 2018-11-23 DIAGNOSIS — I10 Essential (primary) hypertension: Secondary | ICD-10-CM

## 2019-01-14 ENCOUNTER — Encounter: Payer: Self-pay | Admitting: Sports Medicine

## 2019-01-14 ENCOUNTER — Ambulatory Visit (INDEPENDENT_AMBULATORY_CARE_PROVIDER_SITE_OTHER): Payer: Medicare Other | Admitting: Sports Medicine

## 2019-01-14 VITALS — BP 123/65 | HR 82 | Temp 98.0°F | Ht <= 58 in | Wt 150.0 lb

## 2019-01-14 DIAGNOSIS — N750 Cyst of Bartholin's gland: Secondary | ICD-10-CM

## 2019-01-14 MED ORDER — DOXYCYCLINE HYCLATE 100 MG PO TABS: 100.0000 mg | ORAL_TABLET | Freq: Two times a day (BID) | ORAL | 0 refills | Status: AC

## 2019-01-14 MED ORDER — KETOROLAC TROMETHAMINE 30 MG/ML IJ SOLN
30.0000 mg | Freq: Once | INTRAMUSCULAR | Status: AC
  Administered 2019-01-14: 30 mg via INTRAMUSCULAR

## 2019-01-14 MED ORDER — HYDROCODONE-ACETAMINOPHEN 5-325 MG PO TABS: 1.0000 | ORAL_TABLET | Freq: Three times a day (TID) | ORAL | 0 refills | Status: DC | PRN

## 2019-01-14 NOTE — Progress Notes (Signed)
  Subjective:    CC: Lump pelvis  HPI: This is a pleasant 68 year old female, recently she developed a painful lump in her right labia minora.  Symptoms are moderate, persistent, localized without radiation, no drainage, no trauma.  I reviewed the past medical history, family history, social history, surgical history, and allergies today and no changes were needed.  Please see the problem list section below in epic for further details.  Past Medical History: Past Medical History:  Diagnosis Date  . Hyperlipidemia   . Hypertension    Past Surgical History: Past Surgical History:  Procedure Laterality Date  . CESAREAN SECTION    . CORONARY ARTERY BYPASS GRAFT     Social History: Social History   Socioeconomic History  . Marital status: Married    Spouse name: Not on file  . Number of children: Not on file  . Years of education: Not on file  . Highest education level: Not on file  Occupational History  . Not on file  Social Needs  . Financial resource strain: Not on file  . Food insecurity    Worry: Not on file    Inability: Not on file  . Transportation needs    Medical: Not on file    Non-medical: Not on file  Tobacco Use  . Smoking status: Former Research scientist (life sciences)  . Smokeless tobacco: Never Used  Substance and Sexual Activity  . Alcohol use: No    Alcohol/week: 0.0 standard drinks  . Drug use: No  . Sexual activity: Never  Lifestyle  . Physical activity    Days per week: Not on file    Minutes per session: Not on file  . Stress: Not on file  Relationships  . Social Herbalist on phone: Not on file    Gets together: Not on file    Attends religious service: Not on file    Active member of club or organization: Not on file    Attends meetings of clubs or organizations: Not on file    Relationship status: Not on file  Other Topics Concern  . Not on file  Social History Narrative  . Not on file   Family History: No family history on file. Allergies:  Allergies  Allergen Reactions  . Influenza Vaccines    Medications: See med rec.  Review of Systems: No fevers, chills, night sweats, weight loss, chest pain, or shortness of breath.   Objective:    General: Well Developed, well nourished, and in no acute distress.  Neuro: Alert and oriented x3, extra-ocular muscles intact, sensation grossly intact.  HEENT: Normocephalic, atraumatic, pupils equal round reactive to light, neck supple, no masses, no lymphadenopathy, thyroid nonpalpable.  Skin: Warm and dry, no rashes. Cardiac: Regular rate and rhythm, no murmurs rubs or gallops, no lower extremity edema.  Respiratory: Clear to auscultation bilaterally. Not using accessory muscles, speaking in full sentences. Pelvis: With female chaperones I visualized her labia minora, she had what felt to be a Bartholin cyst abscess, well-defined, painful, tender, hot, movable.  No drainage, no discharge.  Impression and Recommendations:    Cyst of right Bartholin's gland Adding doxycycline. Low-dose hydrocodone for pain. She will see Dr. Ihor Dow on Thursday at 830 at the [facility] location.   ___________________________________________ [name]. Dianah Field, M.D., ABFM., CAQSM. Primary Care and Sports Medicine [facility]  Adjunct Professor of [place] of [facility] [employer]

## 2019-01-14 NOTE — Patient Instructions (Signed)
Bartholin's Cyst or Abscess    A Bartholin's cyst is a fluid-filled sac that forms on a Bartholin's gland. Bartholin's glands are small glands in the folds of skin around the vaginal opening (labia). These glands produce a fluid to moisten (lubricate) the outside of the vagina during sex.  A cyst that is not large or infected may not cause any problems or require treatment. If the cyst gets infected, it is called a Bartholin's abscess. An abscess may cause symptoms such as pain and swelling and is more likely to require treatment.  What are the causes?  This condition may be caused by a blocked Bartholin's gland. These glands can become blocked due to natural buildup of fluid and oils. Bacteria inside of the cyst can cause infection.  In many cases, the cause is not known.  What increases the risk?  You may be at increased risk of developing a Bartholin's cyst or abscess if:  · You are of childbearing age.  · You have a history of Bartholin's cysts or abscesses.  · You have diabetes.  · You have an STI (sexually transmitted infection).  What are the signs or symptoms?  Symptoms may include:  · A bulge or lump on the labia, near the lower opening of the vagina.  · Discomfort or pain. This may get worse during sex or when walking.  · Redness, swelling, or fluid draining from the area. These may be signs of an abscess.  How severe your symptoms are depends on the size of your cyst and whether it is infected. Infection causes symptoms to get more severe.  How is this diagnosed?  This condition may be diagnosed based on:  · Your symptoms and medical history.  · A physical exam to check for swelling in your vaginal area. You may lie on your back on an exam table and have your feet placed into footrests for the exam.  · Blood tests to check for infections.  · Removal of a fluid sample from the cyst or abscess (biopsy) for testing.  You may work with a health care provider who specializes in women's health (gynecologist)  for diagnosis and treatment.  How is this treated?  If your cyst is small, not infected, and not causing symptoms, you may not need any treatment. These cysts often go away on their own, with home care such as hot baths or warm compresses.  If you have a large cyst or an abscess, treatment may include:  · Antibiotic medicine.  · A procedure to drain the fluid inside the cyst or abscess. These procedures involve making an incision in the cyst or abscess so that the fluid drains out, and then one of the following may be done:  ? A small, thin tube (catheter) may be placed inside the cyst or abscess so that it does not close and fill up with fluid again (fistulization). The catheter will be removed at a follow-up visit.  ? The edges of the incision may be stitched to your skin so that the cyst or abscess stays open (marsupialization). This allows it to continue to drain and not fill up with fluid again.  If you have cysts or abscesses that keep returning (recurring) and have required incision and drainage multiple times, your health care provider may talk with you about surgery to remove the Bartholin's gland.  Follow these instructions at home:  Medicines  · Take over-the-counter and prescription medicines only as told by your health care provider.  ·   If you were prescribed an antibiotic medicine, take it as told by your health care provider. Do not stop taking the antibiotic even if your condition improves.  Managing pain and swelling  · Try sitz baths to help with pain and swelling. A sitz bath is a warm water bath in which the water only comes up to your hips and should cover your buttocks. You may take sitz baths several times a day.  · Apply heat to the affected area as often as needed. Use the heat source that your health care provider recommends, such as a moist heat pack or a heating pad.  ? Place a towel between your skin and the heat source.  ? Leave the heat on for 20-30 minutes.  ? Remove the heat if your  skin turns bright red. This is especially important if you are unable to feel pain, heat, or cold. You may have a greater risk of getting burned.  General instructions  · If your cyst or abscess was drained, follow instructions from your health care provider about how to take care of your wound. Use feminine pads as needed to absorb any drainage.  · Do not push on or squeeze your cyst.  · Do not have sex until the cyst has gone away or your wound from drainage has healed.  · Take these steps to help prevent a Bartholin's cyst from returning, and to prevent other Bartholin's cysts from developing:  ? Take a bath or shower once a day. Clean your vaginal area with mild soap and water when you bathe.  ? Practice safe sex to prevent STIs. Talk with your health care provider about how to prevent STIs and which forms of birth control (contraception) may be best for you.  · Keep all follow-up visits as told by your health care provider. This is important.  Contact a health care provider if:  · You have a fever.  · You develop redness, swelling, or pain around your cyst.  · You have fluid, blood, pus, or a bad smell coming from your cyst.  · You have a cyst that gets larger or comes back.  Summary  · A Bartholin's cyst is a fluid-filled sac that forms on a Bartholin's gland. These glands are in the folds of skin around the vaginal opening (labia).  · If your cyst is small, not infected, and not causing symptoms, you may not need any treatment. These cysts often go away on their own, with home care such as hot baths or warm compresses.  · If you have a large cyst or an abscess, your health care provider may perform a procedure to drain the fluid.  · If you have cysts or abscesses that keep returning (recurring) and have required incision and drainage multiple times, your health care provider may talk with you about surgery to remove the Bartholin's gland.  This information is not intended to replace advice given to you by  your health care provider. Make sure you discuss any questions you have with your health care provider.  Document Released: 07/17/2005 Document Revised: 04/18/2017 Document Reviewed: 04/18/2017  Elsevier Interactive Patient Education © 2019 Elsevier Inc.

## 2019-01-14 NOTE — Assessment & Plan Note (Signed)
Adding doxycycline. Low-dose hydrocodone for pain. She will see Dr. Ihor Dow on Thursday at 830 at the [facility] location.

## 2019-01-16 ENCOUNTER — Ambulatory Visit (INDEPENDENT_AMBULATORY_CARE_PROVIDER_SITE_OTHER): Payer: Medicare Other | Admitting: Obstetrics & Gynecology

## 2019-01-16 ENCOUNTER — Other Ambulatory Visit: Payer: Self-pay

## 2019-01-16 ENCOUNTER — Encounter: Payer: Self-pay | Admitting: Obstetrics & Gynecology

## 2019-01-16 VITALS — BP 133/50 | HR 61 | Ht <= 58 in | Wt 152.0 lb

## 2019-01-16 DIAGNOSIS — E669 Obesity, unspecified: Secondary | ICD-10-CM | POA: Diagnosis not present

## 2019-01-16 DIAGNOSIS — F4320 Adjustment disorder, unspecified: Secondary | ICD-10-CM

## 2019-01-16 DIAGNOSIS — N764 Abscess of vulva: Secondary | ICD-10-CM | POA: Diagnosis not present

## 2019-01-16 DIAGNOSIS — E1169 Type 2 diabetes mellitus with other specified complication: Secondary | ICD-10-CM

## 2019-01-16 NOTE — Progress Notes (Signed)
Subjective:     Tracey Marshall is a 68 y.o. female here for a routine exam.  Current complaints: Pt was seen and dx with a vulvar abscess. She was given and atbx however, she took one pill and it made her feel sick so she did not take any more. She reports that the lesions started to drain and now is less tender. She is worried about the cause of the abscess.      Gynecologic History No LMP recorded. Patient is postmenopausal. Contraception: post menopausal status Obstetric History OB History  Gravida Para Term Preterm AB Living  4            SAB TAB Ectopic Multiple Live Births          4    # Outcome Date GA Lbr Len/2nd Weight Sex Delivery Anes PTL Lv  4 Gravida      CS-LTranv     3 Gravida      CS-LTranv     2 Gravida      CS-LTranv     1 Gravida      CS-LTranv       Review of Systems Pertinent items are noted in HPI.    Objective:  BP (!) 133/50   Pulse 61   Ht 4\' 9"  (1.448 m)   Wt 152 lb (68.9 kg)   BMI 32.89 kg/m   CONSTITUTIONAL: Well-developed, well-nourished female in no acute distress.  HENT:  Normocephalic, atraumatic EYES: Conjunctivae and EOM are normal. No scleral icterus.  NECK: Normal range of motion SKIN: Skin is warm and dry. No rash noted. Not diaphoretic.No pallor. Coram: Alert and oriented to person, place, and time. Normal coordination.  GU: EGBUS: there is a resolving abscess on the right labia majus. This is not a Bartholin's. There is no fluctuance but, mild erythema. This area is nontender. No drainage with attempt at expression. The area of edema is about 1x 1.5 cm and soft.   Assessment:   Vulvar abscess- right side. Resolving. No evidence of skin infection.      Plan:    f/u in 4 weeks to check for complete resolution.  F/u sooner prn Warm compress qid  Total face-to-face time with patient was 20 min.  Greater than 50% was spent in counseling and coordination of care with the patient.   Tracey Marshall, M.D., [name]

## 2019-01-16 NOTE — Patient Instructions (Signed)
Bartholin's Cyst or Abscess    A Bartholin's cyst is a fluid-filled sac that forms on a Bartholin's gland. Bartholin's glands are small glands in the folds of skin around the vaginal opening (labia). These glands produce a fluid to moisten (lubricate) the outside of the vagina during sex.  A cyst that is not large or infected may not cause any problems or require treatment. If the cyst gets infected, it is called a Bartholin's abscess. An abscess may cause symptoms such as pain and swelling and is more likely to require treatment.  What are the causes?  This condition may be caused by a blocked Bartholin's gland. These glands can become blocked due to natural buildup of fluid and oils. Bacteria inside of the cyst can cause infection.  In many cases, the cause is not known.  What increases the risk?  You may be at increased risk of developing a Bartholin's cyst or abscess if:  · You are of childbearing age.  · You have a history of Bartholin's cysts or abscesses.  · You have diabetes.  · You have an STI (sexually transmitted infection).  What are the signs or symptoms?  Symptoms may include:  · A bulge or lump on the labia, near the lower opening of the vagina.  · Discomfort or pain. This may get worse during sex or when walking.  · Redness, swelling, or fluid draining from the area. These may be signs of an abscess.  How severe your symptoms are depends on the size of your cyst and whether it is infected. Infection causes symptoms to get more severe.  How is this diagnosed?  This condition may be diagnosed based on:  · Your symptoms and medical history.  · A physical exam to check for swelling in your vaginal area. You may lie on your back on an exam table and have your feet placed into footrests for the exam.  · Blood tests to check for infections.  · Removal of a fluid sample from the cyst or abscess (biopsy) for testing.  You may work with a health care provider who specializes in women's health (gynecologist)  for diagnosis and treatment.  How is this treated?  If your cyst is small, not infected, and not causing symptoms, you may not need any treatment. These cysts often go away on their own, with home care such as hot baths or warm compresses.  If you have a large cyst or an abscess, treatment may include:  · Antibiotic medicine.  · A procedure to drain the fluid inside the cyst or abscess. These procedures involve making an incision in the cyst or abscess so that the fluid drains out, and then one of the following may be done:  ? A small, thin tube (catheter) may be placed inside the cyst or abscess so that it does not close and fill up with fluid again (fistulization). The catheter will be removed at a follow-up visit.  ? The edges of the incision may be stitched to your skin so that the cyst or abscess stays open (marsupialization). This allows it to continue to drain and not fill up with fluid again.  If you have cysts or abscesses that keep returning (recurring) and have required incision and drainage multiple times, your health care provider may talk with you about surgery to remove the Bartholin's gland.  Follow these instructions at home:  Medicines  · Take over-the-counter and prescription medicines only as told by your health care provider.  ·   If you were prescribed an antibiotic medicine, take it as told by your health care provider. Do not stop taking the antibiotic even if your condition improves.  Managing pain and swelling  · Try sitz baths to help with pain and swelling. A sitz bath is a warm water bath in which the water only comes up to your hips and should cover your buttocks. You may take sitz baths several times a day.  · Apply heat to the affected area as often as needed. Use the heat source that your health care provider recommends, such as a moist heat pack or a heating pad.  ? Place a towel between your skin and the heat source.  ? Leave the heat on for 20-30 minutes.  ? Remove the heat if your  skin turns bright red. This is especially important if you are unable to feel pain, heat, or cold. You may have a greater risk of getting burned.  General instructions  · If your cyst or abscess was drained, follow instructions from your health care provider about how to take care of your wound. Use feminine pads as needed to absorb any drainage.  · Do not push on or squeeze your cyst.  · Do not have sex until the cyst has gone away or your wound from drainage has healed.  · Take these steps to help prevent a Bartholin's cyst from returning, and to prevent other Bartholin's cysts from developing:  ? Take a bath or shower once a day. Clean your vaginal area with mild soap and water when you bathe.  ? Practice safe sex to prevent STIs. Talk with your health care provider about how to prevent STIs and which forms of birth control (contraception) may be best for you.  · Keep all follow-up visits as told by your health care provider. This is important.  Contact a health care provider if:  · You have a fever.  · You develop redness, swelling, or pain around your cyst.  · You have fluid, blood, pus, or a bad smell coming from your cyst.  · You have a cyst that gets larger or comes back.  Summary  · A Bartholin's cyst is a fluid-filled sac that forms on a Bartholin's gland. These glands are in the folds of skin around the vaginal opening (labia).  · If your cyst is small, not infected, and not causing symptoms, you may not need any treatment. These cysts often go away on their own, with home care such as hot baths or warm compresses.  · If you have a large cyst or an abscess, your health care provider may perform a procedure to drain the fluid.  · If you have cysts or abscesses that keep returning (recurring) and have required incision and drainage multiple times, your health care provider may talk with you about surgery to remove the Bartholin's gland.  This information is not intended to replace advice given to you by  your health care provider. Make sure you discuss any questions you have with your health care provider.  Document Released: 07/17/2005 Document Revised: 04/18/2017 Document Reviewed: 04/18/2017  Elsevier Interactive Patient Education © 2019 Elsevier Inc.

## 2019-01-16 NOTE — Progress Notes (Signed)
Patient referred from [facility] primary care for Bartholins gland I&D.patient states that it began to drain some discharge yesterday. Kathrene Alu RN

## 2019-01-17 ENCOUNTER — Encounter: Payer: Self-pay | Admitting: Obstetrics & Gynecology

## 2019-02-13 ENCOUNTER — Ambulatory Visit: Payer: Medicare Other | Admitting: Obstetrics & Gynecology

## 2019-03-10 ENCOUNTER — Telehealth: Payer: Self-pay | Admitting: Sports Medicine

## 2019-03-10 ENCOUNTER — Encounter: Payer: Self-pay | Admitting: Sports Medicine

## 2019-03-10 NOTE — Telephone Encounter (Signed)
Patient came into office stating she had some diarrhea, and it has now passed but her employer is not allowing her to return to work without a note because this is now a covid symptom.

## 2019-03-31 ENCOUNTER — Other Ambulatory Visit: Payer: Self-pay

## 2019-03-31 ENCOUNTER — Encounter: Payer: Self-pay | Admitting: Sports Medicine

## 2019-03-31 ENCOUNTER — Ambulatory Visit (INDEPENDENT_AMBULATORY_CARE_PROVIDER_SITE_OTHER): Payer: Medicare Other | Admitting: Sports Medicine

## 2019-03-31 DIAGNOSIS — Q828 Other specified congenital malformations of skin: Secondary | ICD-10-CM | POA: Insufficient documentation

## 2019-03-31 DIAGNOSIS — G5603 Carpal tunnel syndrome, bilateral upper limbs: Secondary | ICD-10-CM

## 2019-03-31 DIAGNOSIS — L918 Other hypertrophic disorders of the skin: Secondary | ICD-10-CM | POA: Diagnosis not present

## 2019-03-31 MED ORDER — LIDOCAINE 5 % EX OINT: 1.0000 "application " | TOPICAL_OINTMENT | Freq: Every day | CUTANEOUS | 11 refills | Status: DC

## 2019-03-31 NOTE — Addendum Note (Signed)
Addended by: Silverio Decamp on: 03/31/2019 03:39 PM   Modules accepted: Orders

## 2019-03-31 NOTE — Assessment & Plan Note (Signed)
Bilateral median nerve Hydro dissections, return as needed

## 2019-03-31 NOTE — Progress Notes (Addendum)
Subjective:    CC: Bilateral hand numbness and tingling  HPI: This is a pleasant 68 year old female with bilateral carpal tunnel syndrome, previous injections were November 2019 on the right and March 2020 on the left.  She is having a bilateral recurrence of symptoms, moderate, persistent, desires repeat interventional treatment today.  I reviewed the past medical history, family history, social history, surgical history, and allergies today and no changes were needed.  Please see the problem list section below in epic for further details.  Past Medical History: Past Medical History:  Diagnosis Date  . Hyperlipidemia   . Hypertension    Past Surgical History: Past Surgical History:  Procedure Laterality Date  . CESAREAN SECTION    . CORONARY ARTERY BYPASS GRAFT     Social History: Social History   Socioeconomic History  . Marital status: Married    Spouse name: Not on file  . Number of children: Not on file  . Years of education: Not on file  . Highest education level: Not on file  Occupational History  . Not on file  Social Needs  . Financial resource strain: Not on file  . Food insecurity    Worry: Not on file    Inability: Not on file  . Transportation needs    Medical: Not on file    Non-medical: Not on file  Tobacco Use  . Smoking status: Former Research scientist (life sciences)  . Smokeless tobacco: Never Used  Substance and Sexual Activity  . Alcohol use: No    Alcohol/week: 0.0 standard drinks  . Drug use: No  . Sexual activity: Never  Lifestyle  . Physical activity    Days per week: Not on file    Minutes per session: Not on file  . Stress: Not on file  Relationships  . Social Herbalist on phone: Not on file    Gets together: Not on file    Attends religious service: Not on file    Active member of club or organization: Not on file    Attends meetings of clubs or organizations: Not on file    Relationship status: Not on file  Other Topics Concern  . Not on  file  Social History Narrative  . Not on file   Family History: Family History  Problem Relation Age of Onset  . Diabetes Sister   . Cancer Neg Hx   . Hypertension Neg Hx    Allergies: Allergies  Allergen Reactions  . Influenza Vaccines    Medications: See med rec.  Review of Systems: No fevers, chills, night sweats, weight loss, chest pain, or shortness of breath.   Objective:    General: Well Developed, well nourished, and in no acute distress.  Neuro: Alert and oriented x3, extra-ocular muscles intact, sensation grossly intact.  HEENT: Normocephalic, atraumatic, pupils equal round reactive to light, neck supple, no masses, no lymphadenopathy, thyroid nonpalpable.  Skin: Warm and dry, no rashes. Cardiac: Regular rate and rhythm, no murmurs rubs or gallops, no lower extremity edema.  Respiratory: Clear to auscultation bilaterally. Not using accessory muscles, speaking in full sentences. Bilateral wrists: Inspection normal with no visible erythema or swelling. ROM smooth and normal with good flexion and extension and ulnar/radial deviation that is symmetrical with opposite wrist. Palpation is normal over metacarpals, navicular, lunate, and TFCC; tendons without tenderness/ swelling No snuffbox tenderness. No tenderness over Canal of Guyon. Strength 5/5 in all directions without pain. Positive Tinel's and phalens signs. Negative Finkelstein sign. Negative Watson's  test.  Procedure: Real-time Ultrasound Guided hydrodissection of the left median nerve at the carpal tunnel Device: GE Logiq E  Verbal informed consent obtained.  Time-out conducted.  Noted no overlying erythema, induration, or other signs of local infection.  Skin prepped in a sterile fashion.  Local anesthesia: Topical Ethyl chloride.  With sterile technique and under real time ultrasound guidance: Using a 25-gauge needle advanced into the carpal tunnel, taking care to avoid intraneural injection I injected  medication both superficial to and deep to the median nerve freeing it from surrounding structures, I then redirected the needle deep and injected further medication around the flexor tendons deep within the carpal tunnel for a total of 1 cc kenalog 40, 5 cc sterile saline. Completed without difficulty  Advised to call if fevers/chills, erythema, induration, drainage, or persistent bleeding.  Images permanently stored and available for review in the ultrasound unit.  Impression: Technically successful ultrasound guided median nerve hydrodissection.  Procedure: Real-time Ultrasound Guided hydrodissection of the right median nerve at the carpal tunnel Device: GE Logiq E  Verbal informed consent obtained.  Time-out conducted.  Noted no overlying erythema, induration, or other signs of local infection.  Skin prepped in a sterile fashion.  Local anesthesia: Topical Ethyl chloride.  With sterile technique and under real time ultrasound guidance: Using a 25-gauge needle advanced into the carpal tunnel, taking care to avoid intraneural injection I injected medication both superficial to and deep to the median nerve freeing it from surrounding structures, I then redirected the needle deep and injected further medication around the flexor tendons deep within the carpal tunnel for a total of 1 cc kenalog 40, 5 cc sterile saline. Completed without difficulty  Advised to call if fevers/chills, erythema, induration, drainage, or persistent bleeding.  Images permanently stored and available for review in the ultrasound unit.  Impression: Technically successful ultrasound guided median nerve hydrodissection.  Impression and Recommendations:    Carpal tunnel syndrome Bilateral median nerve Hydro dissections, return as needed  Accessory skin tags Adding topical lidocaine to be applied 2 hours before procedure. These were present over her neck.   ___________________________________________ [name].  Dianah Field, M.D., ABFM., CAQSM. Primary Care and Sports Medicine [facility]  Adjunct Professor of [place] [employer] [facility] [employer] [facility] [employer] [facility] [employer]

## 2019-03-31 NOTE — Assessment & Plan Note (Addendum)
Adding topical lidocaine to be applied 2 hours before procedure. These were present over her neck.

## 2019-04-04 ENCOUNTER — Encounter: Payer: Self-pay | Admitting: Sports Medicine

## 2019-04-04 ENCOUNTER — Ambulatory Visit (INDEPENDENT_AMBULATORY_CARE_PROVIDER_SITE_OTHER): Payer: Medicare Other | Admitting: Sports Medicine

## 2019-04-04 ENCOUNTER — Other Ambulatory Visit: Payer: Self-pay

## 2019-04-04 DIAGNOSIS — Q828 Other specified congenital malformations of skin: Secondary | ICD-10-CM | POA: Diagnosis not present

## 2019-04-04 DIAGNOSIS — L919 Hypertrophic disorder of the skin, unspecified: Secondary | ICD-10-CM | POA: Diagnosis not present

## 2019-04-04 NOTE — Assessment & Plan Note (Signed)
Hyfrecation of approximately 30 skin tags around the neck. She did apply topical lidocaine prior. Return as needed.

## 2019-04-04 NOTE — Progress Notes (Signed)
   Procedure: Hyfrecation of approximately 25 skin tags around the neck Risks, benefits, and alternatives explained and consent obtained. Time out conducted. The patient applied topical lidocaine for analgesia Adequate anesthesia ensured. Area prepped and draped in a sterile fashion. I used a Hyfrecator to Pharmacologist skin tag. Pt stable.

## 2019-04-15 ENCOUNTER — Telehealth: Payer: Self-pay | Admitting: *Deleted

## 2019-04-15 NOTE — Telephone Encounter (Signed)
Pt left vm today just wanting you know that everything looks good and to tell you thanks.

## 2019-04-15 NOTE — Telephone Encounter (Signed)
Excellent

## 2019-06-25 ENCOUNTER — Other Ambulatory Visit: Payer: Self-pay | Admitting: Sports Medicine

## 2019-07-13 ENCOUNTER — Other Ambulatory Visit: Payer: Self-pay | Admitting: Sports Medicine

## 2019-07-13 DIAGNOSIS — E669 Obesity, unspecified: Secondary | ICD-10-CM

## 2019-09-10 ENCOUNTER — Other Ambulatory Visit: Payer: Self-pay | Admitting: Sports Medicine

## 2019-09-10 DIAGNOSIS — E1169 Type 2 diabetes mellitus with other specified complication: Secondary | ICD-10-CM

## 2019-09-10 DIAGNOSIS — I10 Essential (primary) hypertension: Secondary | ICD-10-CM

## 2019-09-10 DIAGNOSIS — E669 Obesity, unspecified: Secondary | ICD-10-CM

## 2019-09-11 NOTE — Telephone Encounter (Signed)
Must make appointment 

## 2019-10-10 LAB — HM MAMMOGRAPHY

## 2019-10-15 ENCOUNTER — Encounter: Payer: Self-pay | Admitting: Sports Medicine

## 2019-10-24 ENCOUNTER — Ambulatory Visit (INDEPENDENT_AMBULATORY_CARE_PROVIDER_SITE_OTHER): Payer: Medicare Other

## 2019-10-24 ENCOUNTER — Ambulatory Visit (INDEPENDENT_AMBULATORY_CARE_PROVIDER_SITE_OTHER): Payer: Medicare Other | Admitting: Sports Medicine

## 2019-10-24 ENCOUNTER — Other Ambulatory Visit: Payer: Self-pay

## 2019-10-24 ENCOUNTER — Other Ambulatory Visit: Payer: Self-pay | Admitting: Sports Medicine

## 2019-10-24 DIAGNOSIS — E1169 Type 2 diabetes mellitus with other specified complication: Secondary | ICD-10-CM

## 2019-10-24 DIAGNOSIS — G5603 Carpal tunnel syndrome, bilateral upper limbs: Secondary | ICD-10-CM

## 2019-10-24 DIAGNOSIS — E669 Obesity, unspecified: Secondary | ICD-10-CM

## 2019-10-24 DIAGNOSIS — M5030 Other cervical disc degeneration, unspecified cervical region: Secondary | ICD-10-CM | POA: Diagnosis not present

## 2019-10-24 NOTE — Progress Notes (Signed)
    Procedures performed today:    Procedure: Real-time Ultrasound Guided hydrodissection of the left median nerve at the carpal tunnel Device: Samsung HS60 Verbal informed consent obtained.  Time-out conducted.  Noted no overlying erythema, induration, or other signs of local infection.  Skin prepped in a sterile fashion.  Local anesthesia: Topical Ethyl chloride.  With sterile technique and under real time ultrasound guidance: Using a 25-gauge needle advanced into the carpal tunnel, taking care to avoid intraneural injection I injected medication both superficial to and deep to the median nerve freeing it from surrounding structures, I then redirected the needle deep and injected further medication around the flexor tendons deep within the carpal tunnel for a total of 1 cc kenalog 40, 5 cc 1% lidocaine without epinephrine. Completed without difficulty  Advised to call if fevers/chills, erythema, induration, drainage, or persistent bleeding.  Images permanently stored and available for review in the ultrasound unit.  Impression: Technically successful ultrasound guided median nerve hydrodissection.  Independent interpretation of notes and tests performed by another provider:   None.  Brief History, Exam, Impression, and Recommendations:    Carpal tunnel syndrome Shannae returns, she is having a worsening carpal tunnel symptoms on the left, her last median nerve hydrodissections were bilateral at the end of August 2020. Today she is only having left-sided symptoms, left-sided median nerve Hydro dissection performed today.  Diabetes mellitus type 2 in obese [facility]) Patient is going to get all of her labs done, ordering them now.  DDD (degenerative disc disease), cervical Neck pain in the left periscapular region, adding x-rays, home rehab exercises, return in 4 to 6 weeks, MRI for interventional planning if no better.    ___________________________________________ [name].  Dianah Field, M.D., ABFM., CAQSM. Primary Care and [place] Instructor of [place] of [facility] of Medicine

## 2019-10-24 NOTE — Assessment & Plan Note (Signed)
Neck pain in the left periscapular region, adding x-rays, home rehab exercises, return in 4 to 6 weeks, MRI for interventional planning if no better.

## 2019-10-24 NOTE — Assessment & Plan Note (Signed)
Patient is going to get all of her labs done, ordering them now.

## 2019-10-24 NOTE — Assessment & Plan Note (Signed)
Tracey Marshall returns, she is having a worsening carpal tunnel symptoms on the left, her last median nerve hydrodissections were bilateral at the end of August 2020. Today she is only having left-sided symptoms, left-sided median nerve Hydro dissection performed today.

## 2019-10-25 LAB — LIPID PANEL W/REFLEX DIRECT LDL
Cholesterol: 121 mg/dL (ref <200)
HDL: 47 mg/dL — ABNORMAL LOW (ref >=50)
LDL Cholesterol (Calc): 53 mg/dL (calc)
Non-HDL Cholesterol (Calc): 74 mg/dL (calc) (ref <130)
Total CHOL/HDL Ratio: 2.6 (calc) (ref <5.0)
Triglycerides: 133 mg/dL (ref <150)

## 2019-10-25 LAB — CBC
HCT: 43.9 % (ref 35.0–45.0)
Hemoglobin: 14.1 g/dL (ref 11.7–15.5)
MCH: 30 pg (ref 27.0–33.0)
MCHC: 32.1 g/dL (ref 32.0–36.0)
MCV: 93.4 fL (ref 80.0–100.0)
MPV: 9.8 fL (ref 7.5–12.5)
Platelets: 287 10*3/uL (ref 140–400)
RBC: 4.7 10*6/uL (ref 3.80–5.10)
RDW: 12.3 % (ref 11.0–15.0)
WBC: 7.8 10*3/uL (ref 3.8–10.8)

## 2019-10-25 LAB — COMPLETE METABOLIC PANEL WITH GFR
AG Ratio: 1.9 (calc) (ref 1.0–2.5)
ALT: 30 U/L — ABNORMAL HIGH (ref 6–29)
AST: 27 U/L (ref 10–35)
Albumin: 4.3 g/dL (ref 3.6–5.1)
Alkaline phosphatase (APISO): 60 U/L (ref 37–153)
BUN/Creatinine Ratio: 15 (calc) (ref 6–22)
BUN: 15 mg/dL (ref 7–25)
CO2: 22 mmol/L (ref 20–32)
Calcium: 9.4 mg/dL (ref 8.6–10.4)
Chloride: 110 mmol/L (ref 98–110)
Creat: 1.02 mg/dL — ABNORMAL HIGH (ref 0.60–0.93)
GFR, Est African American: 65 mL/min/{1.73_m2} (ref >=60)
GFR, Est Non African American: 56 mL/min/{1.73_m2} — ABNORMAL LOW (ref >=60)
Globulin: 2.3 g/dL (calc) (ref 1.9–3.7)
Glucose, Bld: 94 mg/dL (ref 65–99)
Potassium: 4.5 mmol/L (ref 3.5–5.3)
Sodium: 142 mmol/L (ref 135–146)
Total Bilirubin: 0.5 mg/dL (ref 0.2–1.2)
Total Protein: 6.6 g/dL (ref 6.1–8.1)

## 2019-10-25 LAB — TSH: TSH: 1.28 m[IU]/L (ref 0.40–4.50)

## 2019-10-25 LAB — HEMOGLOBIN A1C
Hgb A1c MFr Bld: 6.3 %{Hb} — ABNORMAL HIGH
Mean Plasma Glucose: 134 (calc)
eAG (mmol/L): 7.4 (calc)

## 2019-11-30 ENCOUNTER — Other Ambulatory Visit: Payer: Self-pay | Admitting: Sports Medicine

## 2019-11-30 DIAGNOSIS — I10 Essential (primary) hypertension: Secondary | ICD-10-CM

## 2019-11-30 DIAGNOSIS — E1169 Type 2 diabetes mellitus with other specified complication: Secondary | ICD-10-CM

## 2019-11-30 DIAGNOSIS — E669 Obesity, unspecified: Secondary | ICD-10-CM

## 2019-12-03 ENCOUNTER — Emergency Department (INDEPENDENT_AMBULATORY_CARE_PROVIDER_SITE_OTHER): Payer: Medicare Other

## 2019-12-03 ENCOUNTER — Emergency Department
Admission: EM | Admit: 2019-12-03 | Discharge: 2019-12-03 | Disposition: A | Discharge status: Home / Self Care | Payer: Medicare Other | Source: Home / Self Care

## 2019-12-03 ENCOUNTER — Other Ambulatory Visit: Payer: Self-pay

## 2019-12-03 DIAGNOSIS — J029 Acute pharyngitis, unspecified: Secondary | ICD-10-CM

## 2019-12-03 DIAGNOSIS — J020 Streptococcal pharyngitis: Secondary | ICD-10-CM

## 2019-12-03 DIAGNOSIS — J069 Acute upper respiratory infection, unspecified: Secondary | ICD-10-CM

## 2019-12-03 DIAGNOSIS — R05 Cough: Secondary | ICD-10-CM | POA: Diagnosis not present

## 2019-12-03 LAB — POCT RAPID STREP A (OFFICE): Rapid Strep A Screen: NEGATIVE

## 2019-12-03 MED ORDER — BENZONATATE 100 MG PO CAPS: 100.0000 mg | ORAL_CAPSULE | Freq: Three times a day (TID) | ORAL | 0 refills | Status: DC

## 2019-12-03 NOTE — Discharge Instructions (Signed)
  You may take 500mg acetaminophen every 4-6 hours or in combination with ibuprofen 400-600mg every 6-8 hours as needed for pain, inflammation, and fever.  Be sure to well hydrated with clear liquids and get at least 8 hours of sleep at night, preferably more while sick.   Please follow up with family medicine in 1 week if needed.   
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
## 2019-12-03 NOTE — ED Triage Notes (Signed)
Patient presents to Urgent Care with complaints of dry cough since the day before yesterday. Patient reports last night she took some antibiotics that she ordered on [place] and it helped her at least sleep last night.  Patient educated on proper antibiotic use.

## 2019-12-04 LAB — NOVEL CORONAVIRUS, NAA: SARS-CoV-2, NAA: NOT DETECTED

## 2019-12-04 LAB — STREP A DNA PROBE: Group A Strep Probe: NOT DETECTED

## 2019-12-04 LAB — SARS-COV-2, NAA 2 DAY TAT

## 2019-12-12 ENCOUNTER — Other Ambulatory Visit: Payer: Self-pay

## 2019-12-12 ENCOUNTER — Ambulatory Visit (INDEPENDENT_AMBULATORY_CARE_PROVIDER_SITE_OTHER): Payer: Medicare Other | Admitting: Sports Medicine

## 2019-12-12 ENCOUNTER — Encounter: Payer: Self-pay | Admitting: Sports Medicine

## 2019-12-12 DIAGNOSIS — H6993 Unspecified Eustachian tube disorder, bilateral: Secondary | ICD-10-CM

## 2019-12-12 DIAGNOSIS — J439 Emphysema, unspecified: Secondary | ICD-10-CM

## 2019-12-12 DIAGNOSIS — G5603 Carpal tunnel syndrome, bilateral upper limbs: Secondary | ICD-10-CM | POA: Diagnosis not present

## 2019-12-12 HISTORY — DX: Emphysema, unspecified: J43.9

## 2019-12-12 NOTE — Assessment & Plan Note (Signed)
Tracey Marshall returns, she is having recurrence of pain and numbness and tingling in her right hand, her last median nerve Hydro dissections were back in August 2020 bilateral. Today I performed a right median nerve hydrodissection. The medication injected will probably help her nose and her breathing is well.

## 2019-12-12 NOTE — Assessment & Plan Note (Signed)
That is having a recurrence of eustachian tube dysfunction. She does have difficulty hearing but this resolves when she blows her nose. She will do Flonase, Zyrtec. Return to see me in a month if no better.

## 2019-12-12 NOTE — Progress Notes (Signed)
    Procedures performed today:    Procedure: Real-time Ultrasound Guided hydrodissection of the right median nerve at the carpal tunnel Device: Samsung HS60 Verbal informed consent obtained.  Time-out conducted.  Noted no overlying erythema, induration, or other signs of local infection.  Skin prepped in a sterile fashion.  Local anesthesia: Topical Ethyl chloride.  With sterile technique and under real time ultrasound guidance: Using a 25-gauge needle advanced into the carpal tunnel, taking care to avoid intraneural injection I injected medication both superficial to and deep to the median nerve freeing it from surrounding structures, I then redirected the needle deep and injected further medication around the flexor tendons deep within the carpal tunnel for a total of 1 cc kenalog 40, 5 cc 1% lidocaine without epinephrine. Completed without difficulty  Advised to call if fevers/chills, erythema, induration, drainage, or persistent bleeding.  Images permanently stored and available for review in the ultrasound unit.  Impression: Technically successful ultrasound guided median nerve hydrodissection.  Independent interpretation of notes and tests performed by another provider:   None.  Brief History, Exam, Impression, and Recommendations:    Emphysema lung ([place]) This pleasant 69 year old female former smoker has had a mild cough, no shortness of breath, wheezing, nothing productive, likely due to the recent dust inhalation as well as allergies. On exam her lungs are clear. She did have a chest x-ray in urgent care that did show emphysematous changes. She will do some Tessalon Perles, Zyrtec. I would like to see her back as needed for this.  Carpal tunnel syndrome Gennieve returns, she is having recurrence of pain and numbness and tingling in her right hand, her last median nerve Hydro dissections were back in August 2020 bilateral. Today I performed a right median nerve  hydrodissection. The medication injected will probably help her nose and her breathing is well.  Eustachian tube disorder, bilateral That is having a recurrence of eustachian tube dysfunction. She does have difficulty hearing but this resolves when she blows her nose. She will do Flonase, Zyrtec. Return to see me in a month if no better.    ___________________________________________ [name]. Dianah Field, M.D., ABFM., CAQSM. Primary Care and [place] Instructor of [place] of [facility] of Medicine

## 2019-12-12 NOTE — Assessment & Plan Note (Signed)
This pleasant 69 year old female former smoker has had a mild cough, no shortness of breath, wheezing, nothing productive, likely due to the recent dust inhalation as well as allergies. On exam her lungs are clear. She did have a chest x-ray in urgent care that did show emphysematous changes. She will do some Tessalon Perles, Zyrtec. I would like to see her back as needed for this.

## 2020-01-06 ENCOUNTER — Telehealth: Payer: Self-pay | Admitting: Sports Medicine

## 2020-01-06 NOTE — Telephone Encounter (Signed)
Appointment needed

## 2020-01-06 NOTE — Telephone Encounter (Signed)
Patient wants a letter for work stating she does not have Covid. Throwing up and diarrhea.

## 2020-01-07 ENCOUNTER — Ambulatory Visit (INDEPENDENT_AMBULATORY_CARE_PROVIDER_SITE_OTHER): Payer: Medicare Other | Admitting: Sports Medicine

## 2020-01-07 ENCOUNTER — Encounter: Payer: Self-pay | Admitting: Sports Medicine

## 2020-01-07 DIAGNOSIS — A059 Bacterial foodborne intoxication, unspecified: Secondary | ICD-10-CM | POA: Diagnosis not present

## 2020-01-07 NOTE — Progress Notes (Signed)
    Procedures performed today:    None.  Independent interpretation of notes and tests performed by another provider:   None.  Brief History, Exam, Impression, and Recommendations:    Food poisoning This is a pleasant 69 year old female, she brought some food back to [place], a packaged meal, ate it, developed some vomiting and diarrhea for 1 day, no melena, hematochezia, hematemesis, and completely recovered. She has no fevers, chills, no respiratory symptoms, no anosmia. Her belly is soft and nontender. Her boss is requiring clearance to return to work. Return to see me as needed.    ___________________________________________ [name]. Dianah Field, M.D., ABFM., CAQSM. Primary Care and [place] Instructor of [place] of [facility] of Medicine

## 2020-01-07 NOTE — Assessment & Plan Note (Addendum)
This is a pleasant 69 year old female, she brought some food back to [place], a packaged meal, ate it, developed some vomiting and diarrhea for 1 day, no melena, hematochezia, hematemesis, and completely recovered. She has no fevers, chills, no respiratory symptoms, no anosmia. Her belly is soft and nontender. Her boss is requiring clearance to return to work. Return to see me as needed.

## 2020-02-11 ENCOUNTER — Encounter: Payer: Self-pay | Admitting: Sports Medicine

## 2020-02-11 ENCOUNTER — Ambulatory Visit (INDEPENDENT_AMBULATORY_CARE_PROVIDER_SITE_OTHER): Payer: Medicare Other | Admitting: Sports Medicine

## 2020-02-11 ENCOUNTER — Telehealth: Payer: Self-pay

## 2020-02-11 DIAGNOSIS — J029 Acute pharyngitis, unspecified: Secondary | ICD-10-CM | POA: Diagnosis not present

## 2020-02-11 NOTE — Progress Notes (Signed)
    Procedures performed today:    None.  Independent interpretation of notes and tests performed by another provider:   None.  Brief History, Exam, Impression, and Recommendations:    Throat soreness This is a pleasant 69 year old female former smoker, history of breast cancer, she has been on nasal fluticasone and omeprazole for months now, unfortunately for the past month plus she has had pain in her throat, hoarseness, no fevers, chills, muscle aches, body aches, no upper respiratory symptoms. At this point I think it is absolutely reasonable to set her up with ENT for direct laryngoscopy to ensure that there is no neoplastic cause.    ___________________________________________ [name]. Dianah Field, M.D., ABFM., CAQSM. Primary Care and [place] Instructor of [place] of [facility] of Medicine

## 2020-02-11 NOTE — Assessment & Plan Note (Signed)
This is a pleasant 69 year old female former smoker, history of breast cancer, she has been on nasal fluticasone and omeprazole for months now, unfortunately for the past month plus she has had pain in her throat, hoarseness, no fevers, chills, muscle aches, body aches, no upper respiratory symptoms. At this point I think it is absolutely reasonable to set her up with ENT for direct laryngoscopy to ensure that there is no neoplastic cause.

## 2020-02-11 NOTE — Telephone Encounter (Signed)
Pt called stating she forgot to ask for a work note during her appointment with Dr. Darene Lamer today. Per pt, the letter needs to say pt is negative for Covid.

## 2020-02-11 NOTE — Telephone Encounter (Signed)
I did not test her for COVID-19 so I cannot include this on the work note, feel free to write a note, it does not need to come from [facility].

## 2020-02-12 NOTE — Telephone Encounter (Addendum)
Task completed. Pt called the clinic regarding the letter. Aware of provider's note. Pt requested if letter can be emailed. Informed pt, that the clinic is unable to send letter to her e mail and that this is not a secured method for transmission of information. General letter sent to the patient via [place].

## 2020-03-08 ENCOUNTER — Other Ambulatory Visit: Payer: Self-pay

## 2020-03-08 ENCOUNTER — Ambulatory Visit (INDEPENDENT_AMBULATORY_CARE_PROVIDER_SITE_OTHER): Payer: Medicare Other | Admitting: Sports Medicine

## 2020-03-08 ENCOUNTER — Encounter: Payer: Self-pay | Admitting: Sports Medicine

## 2020-03-08 DIAGNOSIS — M1812 Unilateral primary osteoarthritis of first carpometacarpal joint, left hand: Secondary | ICD-10-CM

## 2020-03-08 MED ORDER — MELOXICAM 15 MG PO TABS: ORAL_TABLET | ORAL | 3 refills | Status: DC

## 2020-03-08 NOTE — Assessment & Plan Note (Addendum)
There is a very pleasant 69 year old female with a new history of pain in her left hand, thumb basal joint, worse with gripping objects. She has tenderness at the base of the first metacarpal. She desires aggressive interventional treatment today, adding x-rays, [facility] injection, thumb basal joint rehab, return to see me in a month. I am also going to add meloxicam.

## 2020-03-08 NOTE — Addendum Note (Signed)
Addended by: Silverio Decamp on: 03/08/2020 11:47 AM   Modules accepted: Orders

## 2020-03-08 NOTE — Progress Notes (Signed)
    Procedures performed today:    None.  Independent interpretation of notes and tests performed by another provider:   None.  Brief History, Exam, Impression, and Recommendations:    Tracey Marshall is a pleasant 69yo female who presented with left thumb pain that has been going on for a couple months. It gets irritated as she uses her thumb more. It hurts at the thumb basal joint. 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 She wants aggressive treatment today with a joint injection. We are also going ot get an Xray of her hand.   [name], MS3    ___________________________________________ [name]. Dianah Field, M.D., ABFM., CAQSM. Primary Care and [place] Instructor of [place] of [facility] of Medicine

## 2020-03-17 ENCOUNTER — Telehealth: Payer: Self-pay

## 2020-03-17 NOTE — Telephone Encounter (Signed)
She had a coronary bypass or gastric bypass?  We have coronary bypass listed.  It is a relative contraindication to people that have had a gastric bypass, and technically okay with people that of have gastric sleeve.

## 2020-03-17 NOTE — Telephone Encounter (Signed)
Patient advised.

## 2020-03-17 NOTE — Telephone Encounter (Signed)
Tracey Marshall called and left a message stating she has had a bypass. She states the Meloxicam information sheet doesn't recommend taking medication if you have had a bypass. Please advise.

## 2020-03-28 IMAGING — DX DG CERVICAL SPINE COMPLETE 4+V
6 series · 6 of 6 positions shown · non-contrast
Comparison: None.

CLINICAL DATA: One week of pain above left scapula.

EXAM:
CERVICAL SPINE - COMPLETE 4+ VIEW

[c-spine lat]
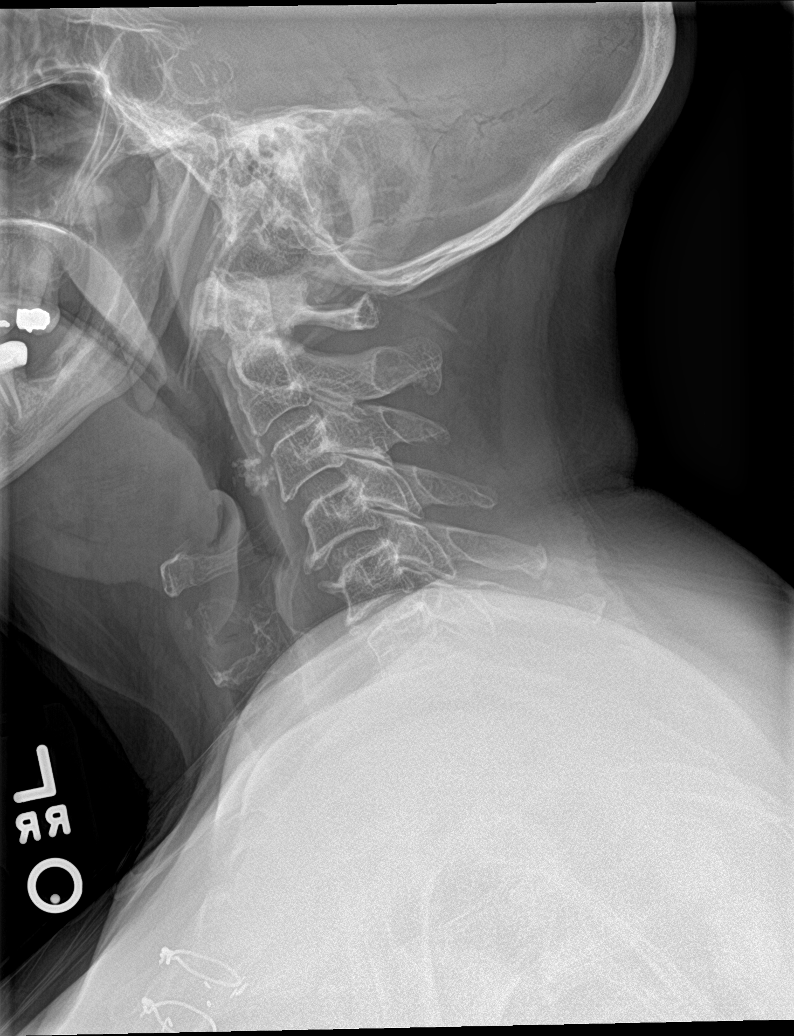

[c-spine obl (1 of 2)]
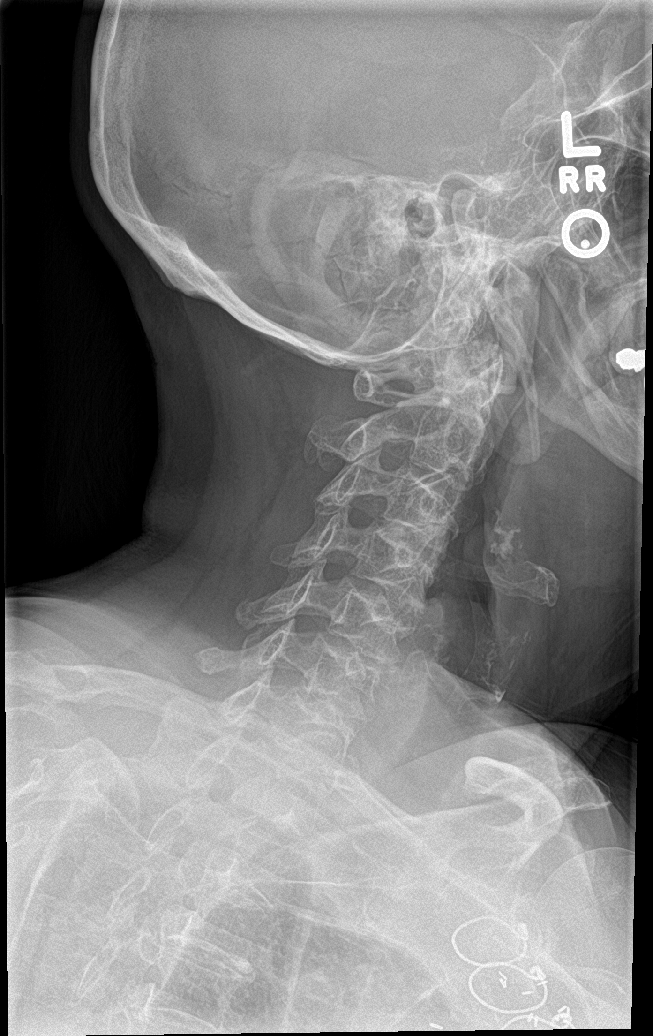

[c-spine obl (2 of 2)]
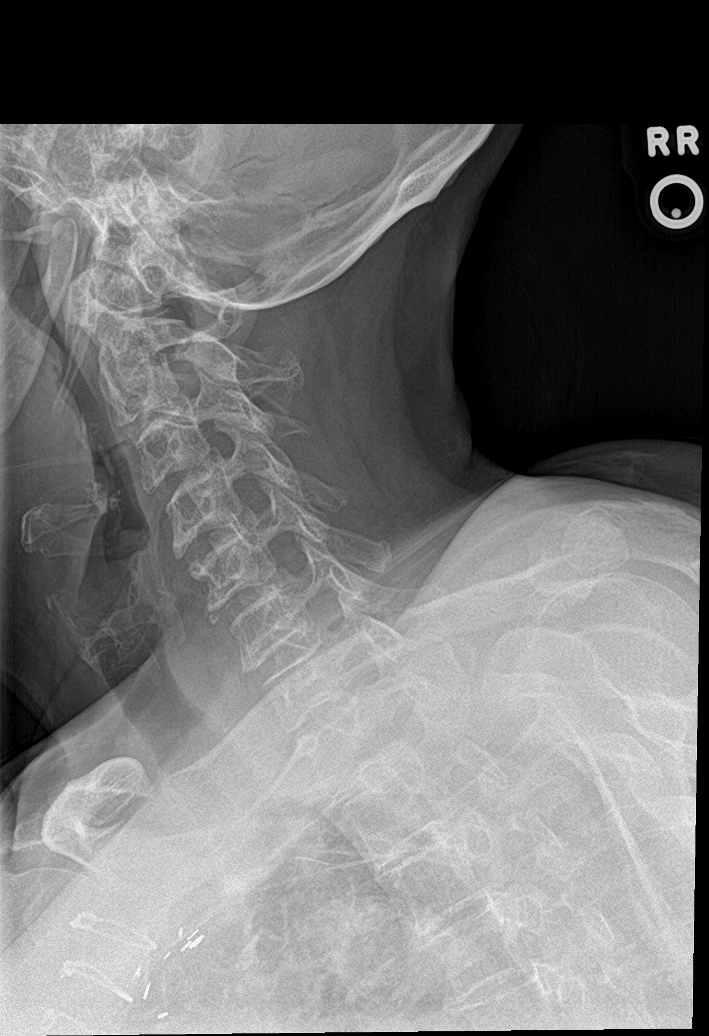

[c-spine ap]
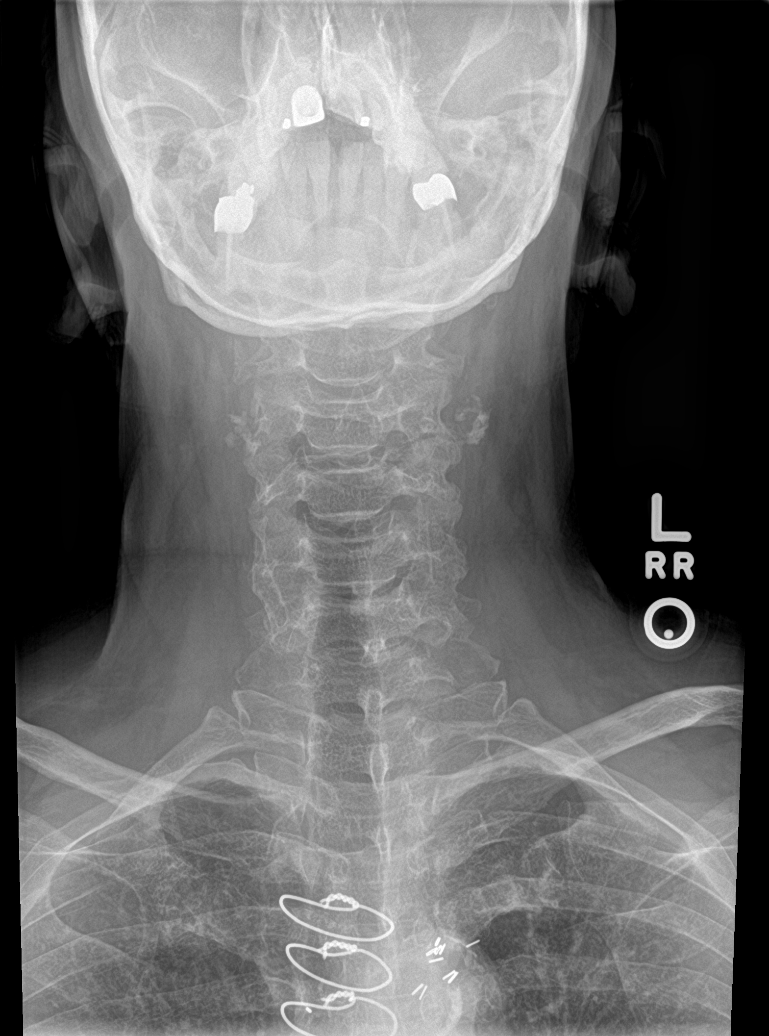

[c-spine open mouth]
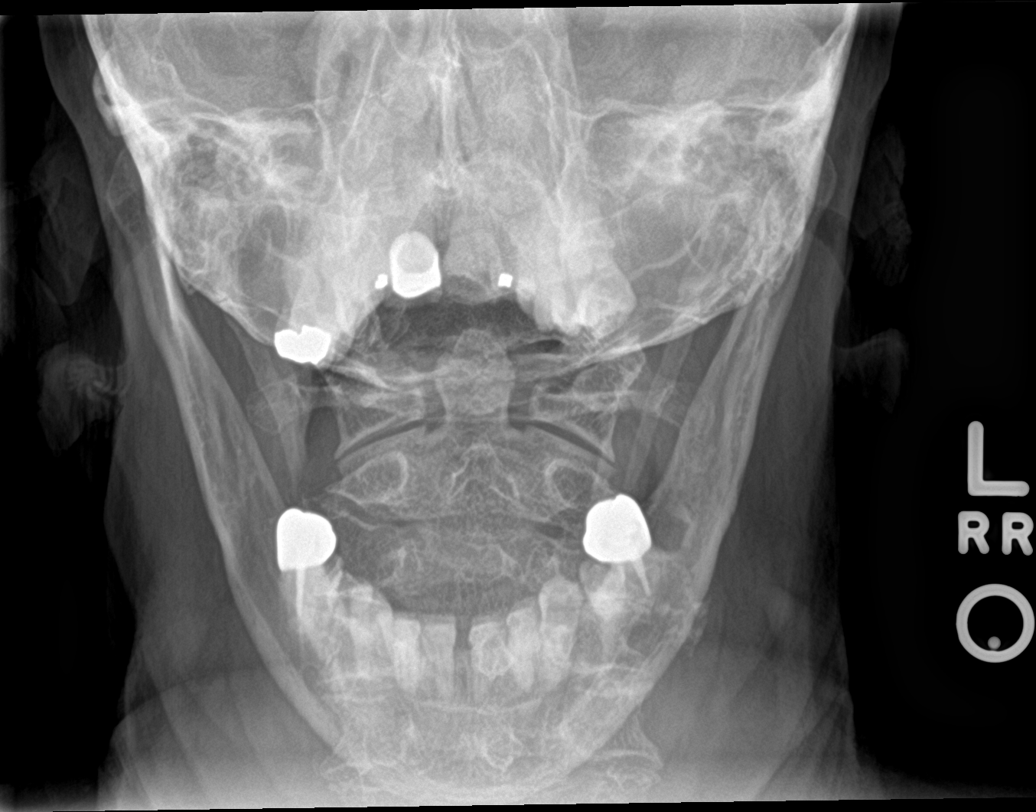

[c-spine swimmers]
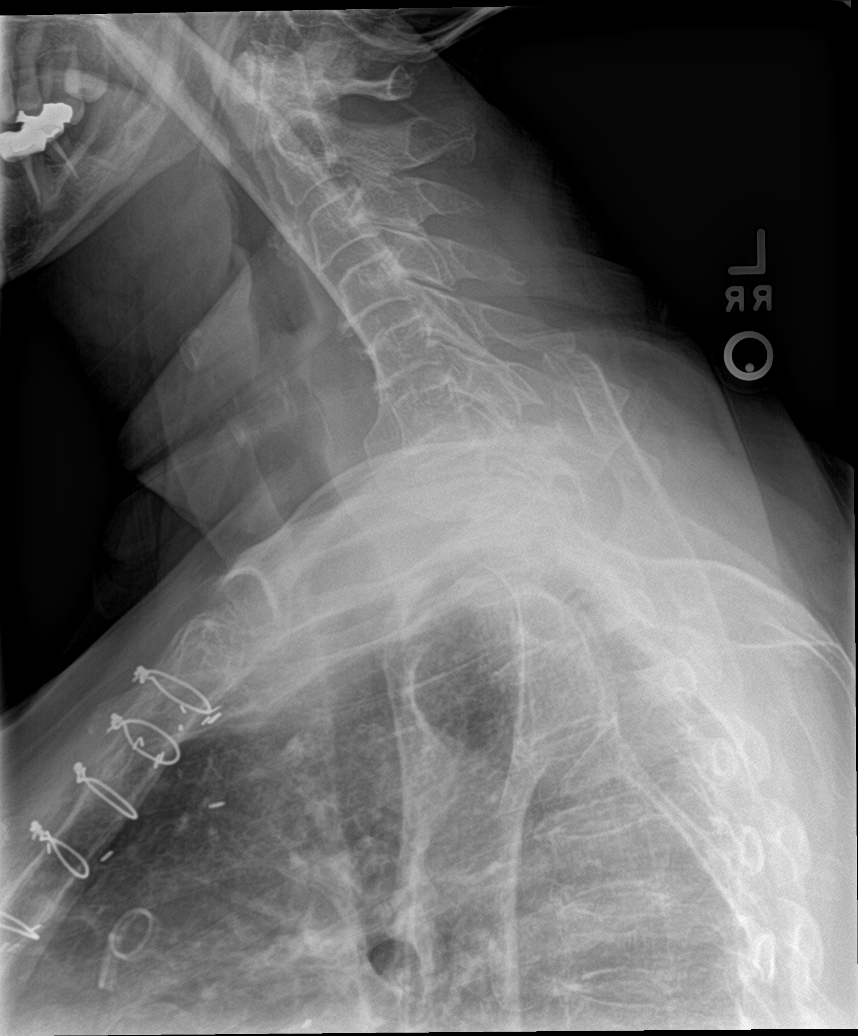

[6 of 6 positions shown; findings below may reference images not displayed]

FINDINGS: There is no evidence of cervical spine fracture or prevertebral soft
tissue swelling. Alignment is normal. Mild degenerative joint
changes of cervical spine is identified with anterior osteophytosis.
There is mild narrowing of the left C3-4 neural foramina due to
osteophyte encroachment.
IMPRESSION: No acute fracture or dislocation. Mild degenerative joint changes of
cervical spine. Mild narrowing of the left C3-4 neural foramina due
to osteophyte encroachment.

## 2020-03-29 ENCOUNTER — Telehealth: Payer: Self-pay | Admitting: Sports Medicine

## 2020-03-29 NOTE — Telephone Encounter (Signed)
Needs letter to return to work. She had a "bad sinus headache" and her employer has requested documentation to return to work.

## 2020-03-29 NOTE — Telephone Encounter (Signed)
Return to work letter written.

## 2020-03-30 ENCOUNTER — Telehealth: Payer: Self-pay | Admitting: *Deleted

## 2020-03-30 NOTE — Telephone Encounter (Signed)
Pt left vm stating that her return to work note needs to specify that she does not have covid, in addition to it stating that she just had a sinus headache.

## 2020-03-30 NOTE — Telephone Encounter (Signed)
Note updated

## 2020-04-06 ENCOUNTER — Other Ambulatory Visit: Payer: Self-pay

## 2020-04-06 ENCOUNTER — Ambulatory Visit (INDEPENDENT_AMBULATORY_CARE_PROVIDER_SITE_OTHER): Payer: Medicare Other

## 2020-04-06 ENCOUNTER — Ambulatory Visit (INDEPENDENT_AMBULATORY_CARE_PROVIDER_SITE_OTHER): Payer: Medicare Other | Admitting: Sports Medicine

## 2020-04-06 DIAGNOSIS — G5603 Carpal tunnel syndrome, bilateral upper limbs: Secondary | ICD-10-CM

## 2020-04-06 DIAGNOSIS — M1812 Unilateral primary osteoarthritis of first carpometacarpal joint, left hand: Secondary | ICD-10-CM

## 2020-04-06 NOTE — Progress Notes (Signed)
    Procedures performed today:    Procedure: Real-time Ultrasound Guided hydrodissection of the left median nerve at the carpal tunnel Device: Samsung HS60 Verbal informed consent obtained.  Time-out conducted.  Noted no overlying erythema, induration, or other signs of local infection.  Skin prepped in a sterile fashion.  Local anesthesia: Topical Ethyl chloride.  With sterile technique and under real time ultrasound guidance: Using a 25-gauge needle advanced into the carpal tunnel, taking care to avoid intraneural injection I injected medication both superficial to and deep to the median nerve freeing it from surrounding structures, I then redirected the needle deep and injected further medication around the flexor tendons deep within the carpal tunnel for a total of 1 cc kenalog 40, 5 cc 1% lidocaine without epinephrine. Completed without difficulty  Advised to call if fevers/chills, erythema, induration, drainage, or persistent bleeding.  Images permanently stored and available for review in PACS.  Impression: Technically successful ultrasound guided median nerve hydrodissection.  Independent interpretation of notes and tests performed by another provider:   None.  Brief History, Exam, Impression, and Recommendations:    Primary osteoarthritis of first carpometacarpal joint of left hand Tracey Marshall is having some improvements in her left thumb basal joint pain, her thumb loop has been somewhat efficacious, but today she is having more numbness and tingling into her thumb, worse at night.  Carpal tunnel syndrome Tracey Marshall's last median nerve injection on the left was in August 2020, we did the right side in May 2021. She is now having increasing paresthesias into her thumb, particularly nocturnal all consistent with carpal tunnel syndrome, repeat left median nerve hydrodissection today, if insufficient improvement in symptoms I would likely refer her for both carpal tunnel release and CMC  arthroplasty at the same time. Return in a month.    ___________________________________________ [name]. Dianah Field, M.D., ABFM., CAQSM. Primary Care and [place] Instructor of [place] of [facility] of Medicine

## 2020-04-06 NOTE — Assessment & Plan Note (Signed)
Koni is having some improvements in her left thumb basal joint pain, her thumb loop has been somewhat efficacious, but today she is having more numbness and tingling into her thumb, worse at night.

## 2020-04-06 NOTE — Assessment & Plan Note (Signed)
Fae's last median nerve injection on the left was in August 2020, we did the right side in May 2021. She is now having increasing paresthesias into her thumb, particularly nocturnal all consistent with carpal tunnel syndrome, repeat left median nerve hydrodissection today, if insufficient improvement in symptoms I would likely refer her for both carpal tunnel release and CMC arthroplasty at the same time. Return in a month.

## 2020-04-12 ENCOUNTER — Ambulatory Visit (INDEPENDENT_AMBULATORY_CARE_PROVIDER_SITE_OTHER): Payer: Medicare Other

## 2020-04-12 ENCOUNTER — Other Ambulatory Visit: Payer: Self-pay

## 2020-04-12 ENCOUNTER — Other Ambulatory Visit: Payer: Medicare Other

## 2020-04-12 ENCOUNTER — Ambulatory Visit (INDEPENDENT_AMBULATORY_CARE_PROVIDER_SITE_OTHER): Payer: Medicare Other | Admitting: Physician Assistant

## 2020-04-12 ENCOUNTER — Encounter: Payer: Self-pay | Admitting: Physician Assistant

## 2020-04-12 VITALS — BP 124/45 | HR 64 | Temp 98.6°F | Ht <= 58 in | Wt 151.0 lb

## 2020-04-12 DIAGNOSIS — R1010 Upper abdominal pain, unspecified: Secondary | ICD-10-CM

## 2020-04-12 DIAGNOSIS — R10821 Right upper quadrant rebound abdominal tenderness: Secondary | ICD-10-CM

## 2020-04-12 DIAGNOSIS — R112 Nausea with vomiting, unspecified: Secondary | ICD-10-CM

## 2020-04-12 DIAGNOSIS — R197 Diarrhea, unspecified: Secondary | ICD-10-CM | POA: Diagnosis not present

## 2020-04-12 DIAGNOSIS — R110 Nausea: Secondary | ICD-10-CM | POA: Diagnosis not present

## 2020-04-12 LAB — COMPLETE METABOLIC PANEL WITH GFR
AG Ratio: 1.7 (calc) (ref 1.0–2.5)
ALT: 25 U/L (ref 6–29)
AST: 19 U/L (ref 10–35)
Albumin: 4 g/dL (ref 3.6–5.1)
Alkaline phosphatase (APISO): 66 U/L (ref 37–153)
BUN/Creatinine Ratio: 22 (calc) (ref 6–22)
BUN: 24 mg/dL (ref 7–25)
CO2: 24 mmol/L (ref 20–32)
Calcium: 9 mg/dL (ref 8.6–10.4)
Chloride: 109 mmol/L (ref 98–110)
Creat: 1.09 mg/dL — ABNORMAL HIGH (ref 0.60–0.93)
GFR, Est African American: 60 mL/min/{1.73_m2} (ref >=60)
GFR, Est Non African American: 51 mL/min/{1.73_m2} — ABNORMAL LOW (ref >=60)
Globulin: 2.4 g/dL (calc) (ref 1.9–3.7)
Glucose, Bld: 83 mg/dL (ref 65–139)
Potassium: 4.7 mmol/L (ref 3.5–5.3)
Sodium: 140 mmol/L (ref 135–146)
Total Bilirubin: 0.3 mg/dL (ref 0.2–1.2)
Total Protein: 6.4 g/dL (ref 6.1–8.1)

## 2020-04-12 LAB — LIPASE: Lipase: 37 U/L (ref 7–60)

## 2020-04-12 LAB — CBC WITH DIFFERENTIAL/PLATELET
Absolute Monocytes: 1187 cells/uL — ABNORMAL HIGH (ref 200–950)
Basophils Absolute: 45 cells/uL (ref 0–200)
Basophils Relative: 0.4 %
Eosinophils Absolute: 215 cells/uL (ref 15–500)
Eosinophils Relative: 1.9 %
HCT: 42.7 % (ref 35.0–45.0)
Hemoglobin: 14.2 g/dL (ref 11.7–15.5)
Lymphs Abs: 2170 cells/uL (ref 850–3900)
MCH: 30.6 pg (ref 27.0–33.0)
MCHC: 33.3 g/dL (ref 32.0–36.0)
MCV: 92 fL (ref 80.0–100.0)
MPV: 9.6 fL (ref 7.5–12.5)
Monocytes Relative: 10.5 %
Neutro Abs: 7684 cells/uL (ref 1500–7800)
Neutrophils Relative %: 68 %
Platelets: 262 10*3/uL (ref 140–400)
RBC: 4.64 10*6/uL (ref 3.80–5.10)
RDW: 12.6 % (ref 11.0–15.0)
Total Lymphocyte: 19.2 %
WBC: 11.3 10*3/uL — ABNORMAL HIGH (ref 3.8–10.8)

## 2020-04-12 NOTE — Progress Notes (Signed)
No acute findings on ultrasound for pain. You do have a left non-obstructing kidney stone. You do have a fatty liver. WBC is a little elevated. Other labs not back yet. So far no change in treatment. Once I get lipase and cmp back will call and discuss next plan.

## 2020-04-12 NOTE — Patient Instructions (Signed)
Get stat labs and ultrasound. Will call with results.
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
## 2020-04-13 ENCOUNTER — Encounter: Payer: Self-pay | Admitting: Physician Assistant

## 2020-04-13 DIAGNOSIS — K760 Fatty (change of) liver, not elsewhere classified: Secondary | ICD-10-CM | POA: Insufficient documentation

## 2020-04-13 NOTE — Progress Notes (Signed)
Tracey Marshall,   Elevated WBC. Normal hemoglobin. Normal lipase(pancreas looks good). Kidney function dropped just a bit. Continue no antiinflammatories and make sure staying hydrated. Liver enzymes look GREAT.  Your ultrasound did not show any acute reason for your symptoms but with the elevated WBC I want to get CT of abdomen. It has been ordered. I also want you to go ahead and increase omeprazole to 40mg  a day to see if can help with your symptoms.

## 2020-04-19 ENCOUNTER — Ambulatory Visit (INDEPENDENT_AMBULATORY_CARE_PROVIDER_SITE_OTHER): Payer: Medicare Other

## 2020-04-19 ENCOUNTER — Other Ambulatory Visit: Payer: Self-pay

## 2020-04-19 DIAGNOSIS — R1013 Epigastric pain: Secondary | ICD-10-CM

## 2020-04-19 MED ORDER — IOHEXOL 300 MG/ML  SOLN
100.0000 mL | Freq: Once | INTRAMUSCULAR | Status: AC | PRN
  Administered 2020-04-19: 100 mL via INTRAVENOUS

## 2020-04-19 NOTE — Progress Notes (Signed)
No acute causes of pain or elevated WBC.  You do have non obstructing kidney stone.  You do have some air cysts in both lungs.  You do have some aortic plaque. You are on crestor for this.   How are you feeling?

## 2020-04-26 ENCOUNTER — Other Ambulatory Visit: Payer: Self-pay | Admitting: Sports Medicine

## 2020-04-27 ENCOUNTER — Encounter: Payer: Self-pay | Admitting: Sports Medicine

## 2020-04-27 ENCOUNTER — Ambulatory Visit: Payer: Self-pay

## 2020-04-27 ENCOUNTER — Ambulatory Visit (INDEPENDENT_AMBULATORY_CARE_PROVIDER_SITE_OTHER): Payer: Medicare Other | Admitting: Sports Medicine

## 2020-04-27 ENCOUNTER — Other Ambulatory Visit: Payer: Self-pay

## 2020-04-27 VITALS — BP 151/70 | HR 71 | Ht <= 58 in | Wt 153.0 lb

## 2020-04-27 DIAGNOSIS — R1013 Epigastric pain: Secondary | ICD-10-CM

## 2020-04-27 DIAGNOSIS — G5603 Carpal tunnel syndrome, bilateral upper limbs: Secondary | ICD-10-CM

## 2020-04-27 MED ORDER — FAMOTIDINE 40 MG PO TABS: 40.0000 mg | ORAL_TABLET | Freq: Two times a day (BID) | ORAL | 0 refills | Status: DC

## 2020-04-27 MED ORDER — OMEPRAZOLE 40 MG PO CPDR: 40.0000 mg | DELAYED_RELEASE_CAPSULE | Freq: Two times a day (BID) | ORAL | 0 refills | Status: DC

## 2020-04-27 NOTE — Assessment & Plan Note (Signed)
Tracey Marshall reports a several week history of midepigastric discomfort with significant eructations, this occurs with almost every food, nonspecific without food as well. Its not worse when laying flat at night, not worse with spicy foods, coffee, or chocolate. No melena, hematochezia, hematemesis. She is currently doing omeprazole 20mg  once daily, we will increase to 40 mg twice daily, adding famotidine twice a day. I like to see her back in 3 to 4 weeks and if persistent symptoms we will proceed with upper endoscopy, she has been on a PPI so we cannot do H. pylori testing just yet.

## 2020-04-27 NOTE — Addendum Note (Signed)
Addended by: Cyd Silence on: 04/27/2020 02:37 PM   Modules accepted: Orders

## 2020-04-27 NOTE — Progress Notes (Signed)
    Procedures performed today:    None.  Independent interpretation of notes and tests performed by another provider:   None.  Brief History, Exam, Impression, and Recommendations:    Midepigastric pain Natale reports a several week history of midepigastric discomfort with significant eructations, this occurs with almost every food, nonspecific without food as well. Its not worse when laying flat at night, not worse with spicy foods, coffee, or chocolate. No melena, hematochezia, hematemesis. She is currently doing omeprazole 20mg  once daily, we will increase to 40 mg twice daily, adding famotidine twice a day. I like to see her back in 3 to 4 weeks and if persistent symptoms we will proceed with upper endoscopy, she has been on a PPI so we cannot do H. pylori testing just yet.  Carpal tunnel syndrome Sena is due for a median nerve hydrodissection but she just had her Covid vaccine so we will have to wait at least another couple of weeks.    ___________________________________________ [name]. Dianah Field, M.D., ABFM., CAQSM. Primary Care and [place] Instructor of [place] of [facility] of Medicine

## 2020-04-27 NOTE — Assessment & Plan Note (Signed)
Tracey Marshall is due for a median nerve hydrodissection but she just had her Covid vaccine so we will have to wait at least another couple of weeks.

## 2020-05-07 IMAGING — DX DG CHEST 2V
2 series · 2 of 2 positions shown · non-contrast
Comparison: None.

CLINICAL DATA: Dry cough, rhonchi right lower lobe

EXAM:
CHEST - 2 VIEW

[chest pa]
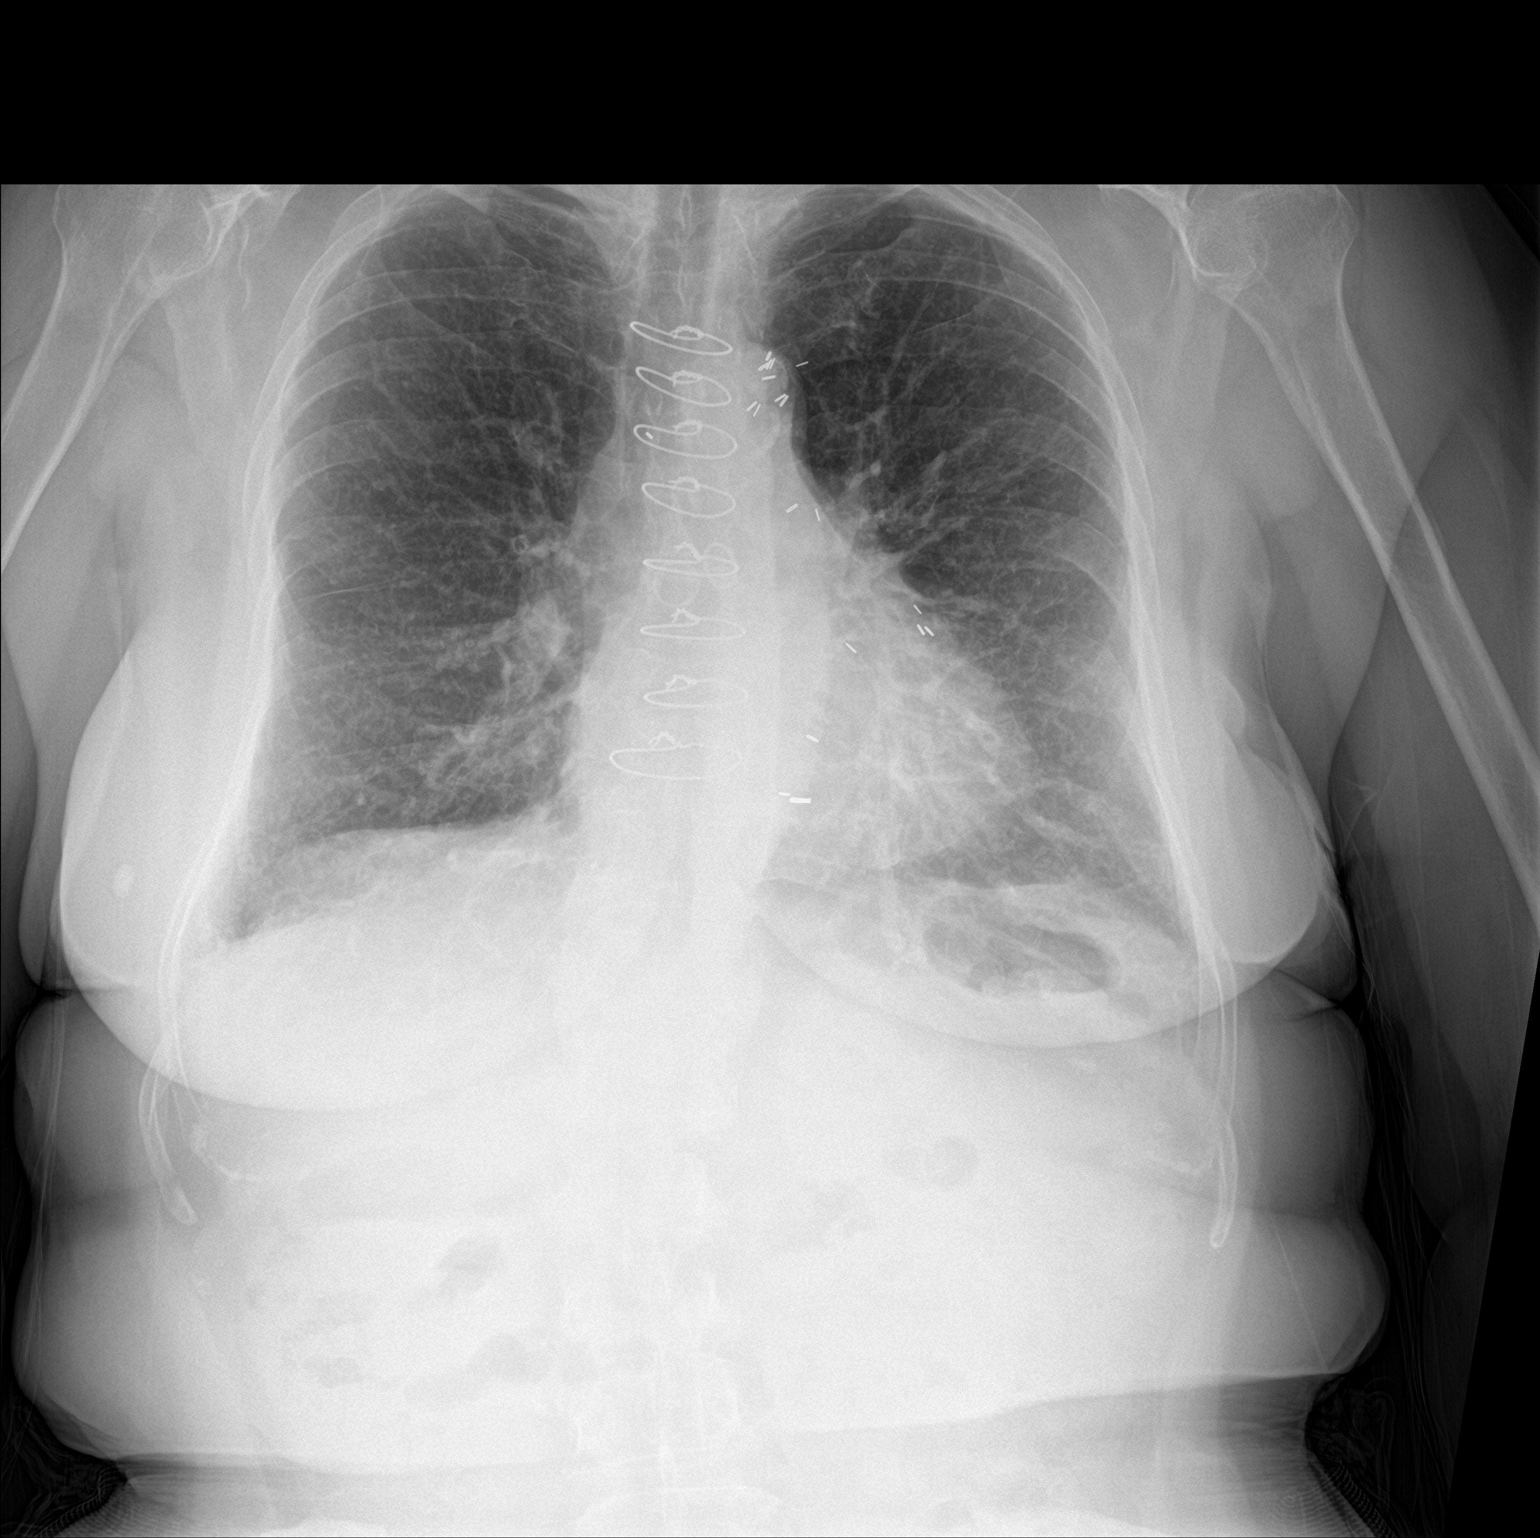

[chest lat]
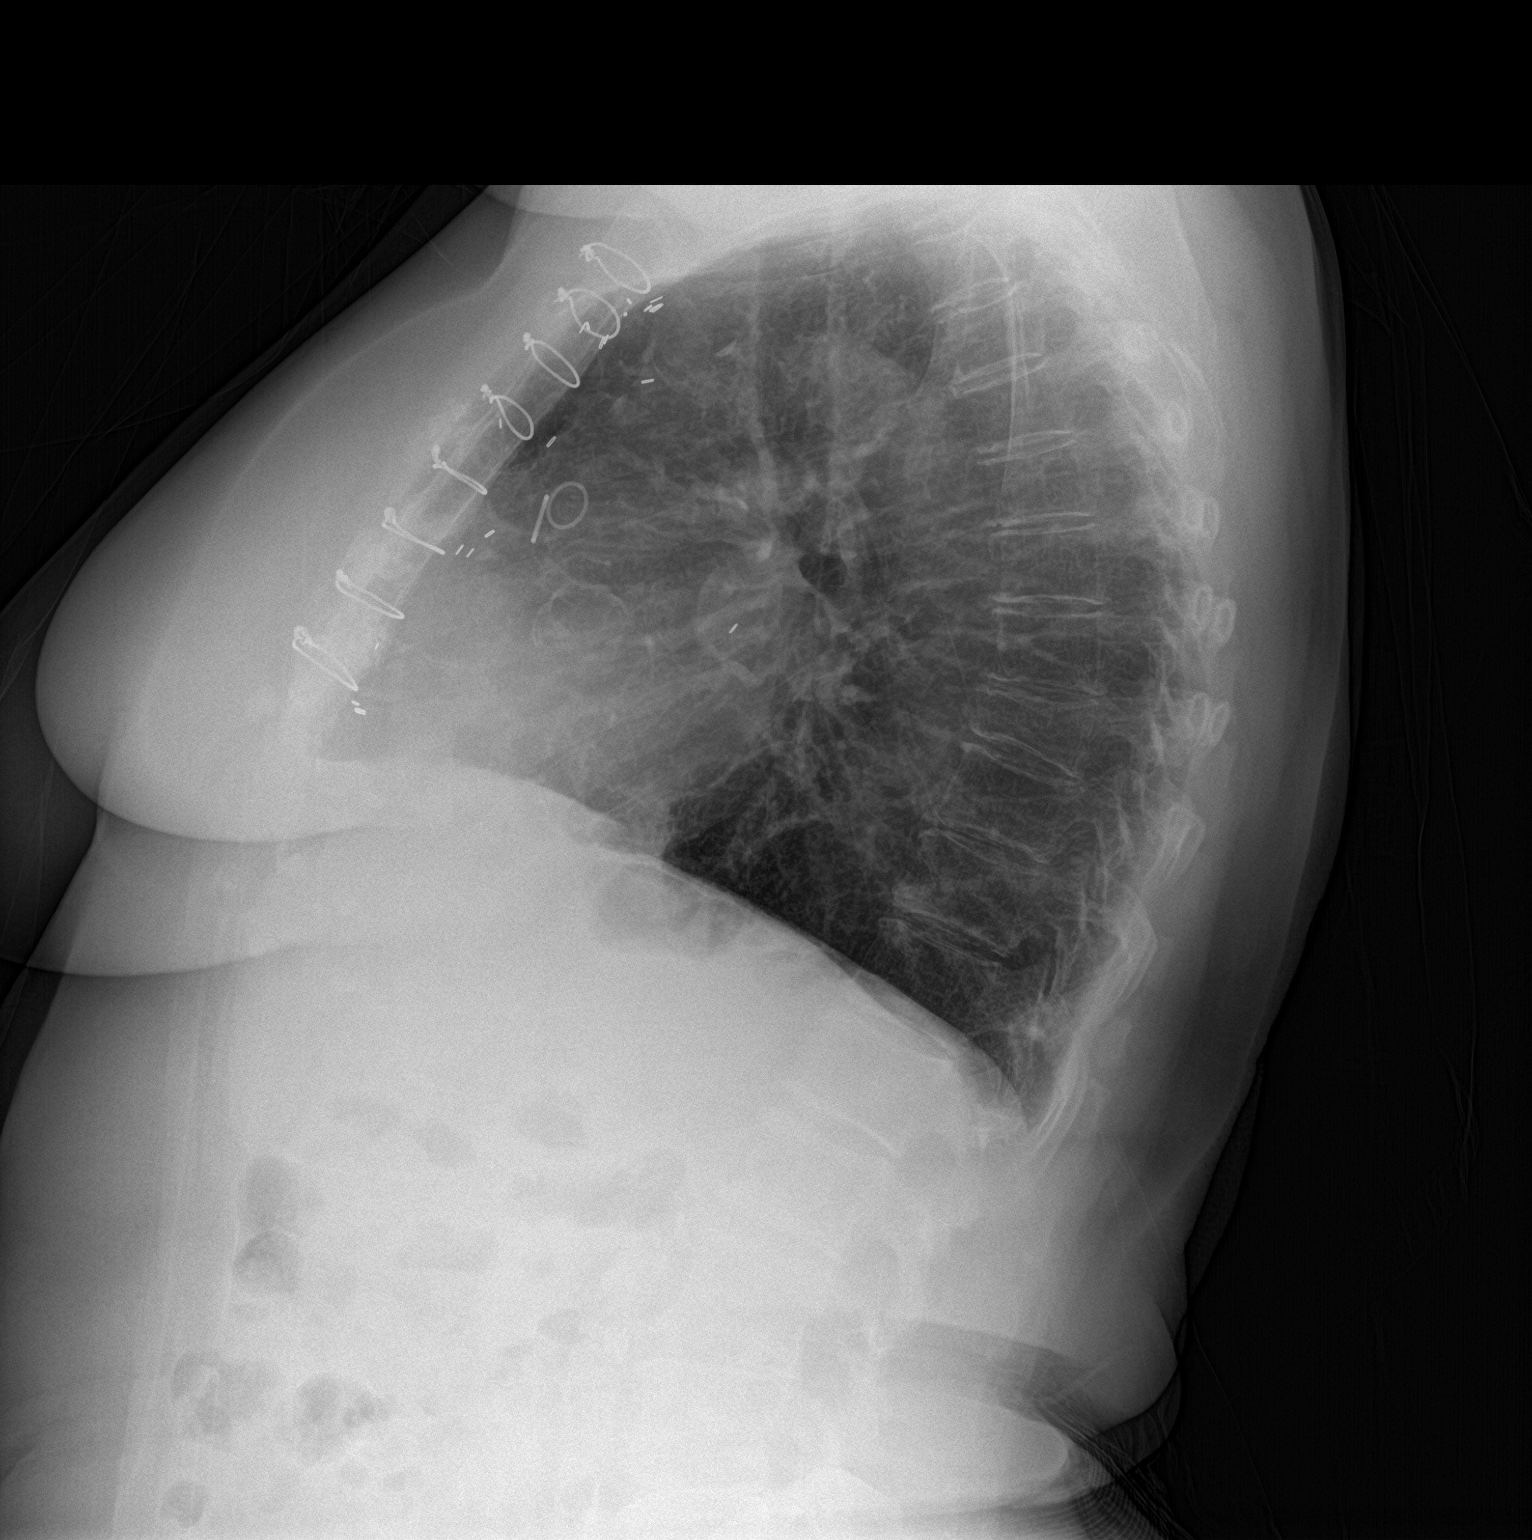

[2 of 2 positions shown; findings below may reference images not displayed]

FINDINGS: Frontal and lateral views of the chest demonstrate postsurgical
changes from CABG. Cardiac silhouette is unremarkable. There is mild
central vascular congestion. The lungs are hyperinflated with
diffuse interstitial prominence compatible with emphysema. No
airspace disease, effusion, or pneumothorax.
IMPRESSION: 1. Emphysema.
2. No acute intrathoracic process.

## 2020-05-17 NOTE — Telephone Encounter (Signed)
Absolutely.

## 2020-05-19 ENCOUNTER — Ambulatory Visit (HOSPITAL_COMMUNITY)
Admission: RE | Admit: 2020-05-19 | Discharge: 2020-05-19 | Disposition: A | Discharge status: Home / Self Care | Payer: Medicare Other | Source: Ambulatory Visit | Attending: Pulmonary Disease | Admitting: Pulmonary Disease

## 2020-05-19 ENCOUNTER — Other Ambulatory Visit: Payer: Self-pay | Admitting: Adult Health

## 2020-05-19 ENCOUNTER — Encounter: Payer: Self-pay | Admitting: Adult Health

## 2020-05-19 DIAGNOSIS — Z23 Encounter for immunization: Secondary | ICD-10-CM | POA: Insufficient documentation

## 2020-05-19 DIAGNOSIS — U071 COVID-19: Secondary | ICD-10-CM | POA: Insufficient documentation

## 2020-05-19 MED ORDER — METHYLPREDNISOLONE SODIUM SUCC 125 MG IJ SOLR: 125.0000 mg | Freq: Once | INTRAMUSCULAR | Status: DC | PRN

## 2020-05-19 MED ORDER — SODIUM CHLORIDE 0.9 % IV SOLN: INTRAVENOUS | Status: DC | PRN

## 2020-05-19 MED ORDER — SODIUM CHLORIDE 0.9 % IV SOLN: Freq: Once | INTRAVENOUS | Status: AC

## 2020-05-19 MED ORDER — ALBUTEROL SULFATE HFA 108 (90 BASE) MCG/ACT IN AERS: 2.0000 | INHALATION_SPRAY | Freq: Once | RESPIRATORY_TRACT | Status: DC | PRN

## 2020-05-19 MED ORDER — EPINEPHRINE 0.3 MG/0.3ML IJ SOAJ: 0.3000 mg | Freq: Once | INTRAMUSCULAR | Status: DC | PRN

## 2020-05-19 MED ORDER — FAMOTIDINE IN NACL 20-0.9 MG/50ML-% IV SOLN: 20.0000 mg | Freq: Once | INTRAVENOUS | Status: DC | PRN

## 2020-05-19 MED ORDER — DIPHENHYDRAMINE HCL 50 MG/ML IJ SOLN: 50.0000 mg | Freq: Once | INTRAMUSCULAR | Status: DC | PRN

## 2020-05-19 NOTE — Progress Notes (Signed)
I connected by phone with Tracey Marshall on 05/19/2020 at 9:56 AM to discuss the potential use of a new treatment for mild to moderate COVID-19 viral infection in non-hospitalized patients.  This patient is a 69 y.o. female that meets the FDA criteria for Emergency Use Authorization of COVID monoclonal antibody casirivimab/imdevimab or bamlanivimab/eteseviamb.  Has a (+) direct SARS-CoV-2 viral test result  Has mild or moderate COVID-19   Is NOT hospitalized due to COVID-19  Is within 10 days of symptom onset  Has at least one of the high risk factor(s) for progression to severe COVID-19 and/or hospitalization as defined in EUA.  Specific high risk criteria : Older age (>/= 71 yo)   I have spoken and communicated the following to the patient or parent/caregiver regarding COVID monoclonal antibody treatment:  1. FDA has authorized the emergency use for the treatment of mild to moderate COVID-19 in adults and pediatric patients with positive results of direct SARS-CoV-2 viral testing who are 17 years of age and older weighing at least 40 kg, and who are at high risk for progressing to severe COVID-19 and/or hospitalization.  2. The significant known and potential risks and benefits of COVID monoclonal antibody, and the extent to which such potential risks and benefits are unknown.  3. Information on available alternative treatments and the risks and benefits of those alternatives, including clinical trials.  4. Patients treated with COVID monoclonal antibody should continue to self-isolate and use infection control measures (e.g., wear mask, isolate, social distance, avoid sharing personal items, clean and disinfect "high touch" surfaces, and frequent handwashing) according to CDC guidelines.   5. The patient or parent/caregiver has the option to accept or refuse COVID monoclonal antibody treatment.  After reviewing this information with the patient, the patient has agreed to receive one of  the available covid 19 monoclonal antibodies and will be provided an appropriate fact sheet prior to infusion. Scot Dock, NP 05/19/2020 9:56 AM

## 2020-05-19 NOTE — Progress Notes (Signed)
  Diagnosis: COVID-19  Physician: Dr Wright  Procedure: Cov[facility] Infusion [facility] Med: bamlanivimab\etesevimab infusion - Provided patient with bamlanimivab\etesevimab fact sheet for patients, parents and caregivers prior to infusion.  Complications: No immediate complications noted.  Discharge: Discharged home   Allen Egerton Rudd 05/19/2020  

## 2020-05-19 NOTE — Discharge Instructions (Signed)

## 2020-05-25 ENCOUNTER — Encounter: Payer: Self-pay | Admitting: Gastroenterology

## 2020-05-25 ENCOUNTER — Encounter: Payer: Self-pay | Admitting: Sports Medicine

## 2020-05-25 ENCOUNTER — Other Ambulatory Visit: Payer: Self-pay

## 2020-05-25 ENCOUNTER — Ambulatory Visit (INDEPENDENT_AMBULATORY_CARE_PROVIDER_SITE_OTHER): Payer: Medicare Other | Admitting: Sports Medicine

## 2020-05-25 DIAGNOSIS — R1013 Epigastric pain: Secondary | ICD-10-CM | POA: Diagnosis not present

## 2020-05-25 NOTE — Progress Notes (Signed)
    Procedures performed today:    None.  Independent interpretation of notes and tests performed by another provider:   None.  Brief History, Exam, Impression, and Recommendations:    Midepigastric pain Tracey Marshall returns, she has had several months of midepigastric pain with significant eructations. No symptoms or signs of upper GI bleed. This occurred with almost every food, but also nonspecific as it occurs without any oral consumption. Not worse with laying flat at night, not worse with spicy foods, coffee, or chocolate. As above no melena, hematochezia, hematemesis. We increased her omeprazole to 40 mg twice daily and added famotidine twice a day. Because she was on omeprazole when I initially saw her we were unable to get the H. pylori urease breath testing. At this point due to persistent symptoms I would like her to see Dr. Bryan Lemma for discussion of upper endoscopy.    ___________________________________________ Tracey Her. Tracey Marshall, M.D., ABFM., CAQSM. Primary Care and [place] Instructor of [place] of [facility] of Medicine

## 2020-05-25 NOTE — Assessment & Plan Note (Signed)
Tracey Marshall returns, she has had several months of midepigastric pain with significant eructations. No symptoms or signs of upper GI bleed. This occurred with almost every food, but also nonspecific as it occurs without any oral consumption. Not worse with laying flat at night, not worse with spicy foods, coffee, or chocolate. As above no melena, hematochezia, hematemesis. We increased her omeprazole to 40 mg twice daily and added famotidine twice a day. Because she was on omeprazole when I initially saw her we were unable to get the H. pylori urease breath testing. At this point due to persistent symptoms I would like her to see Dr. Bryan Lemma for discussion of upper endoscopy.

## 2020-06-07 ENCOUNTER — Ambulatory Visit (INDEPENDENT_AMBULATORY_CARE_PROVIDER_SITE_OTHER): Payer: Medicare Other

## 2020-06-07 ENCOUNTER — Ambulatory Visit (INDEPENDENT_AMBULATORY_CARE_PROVIDER_SITE_OTHER): Payer: Medicare Other | Admitting: Sports Medicine

## 2020-06-07 DIAGNOSIS — G5603 Carpal tunnel syndrome, bilateral upper limbs: Secondary | ICD-10-CM | POA: Diagnosis not present

## 2020-06-07 NOTE — Progress Notes (Signed)
    Procedures performed today:    Procedure: Real-time Ultrasound Guided hydrodissection of the right median nerve at the carpal tunnel Device: Samsung HS60 Verbal informed consent obtained.  Time-out conducted.  Noted no overlying erythema, induration, or other signs of local infection.  Skin prepped in a sterile fashion.  Local anesthesia: Topical Ethyl chloride.  With sterile technique and under real time ultrasound guidance: Using a 25-gauge needle advanced into the carpal tunnel, taking care to avoid intraneural injection I injected medication both superficial to and deep to the median nerve freeing it from surrounding structures, I then redirected the needle deep and injected further medication around the flexor tendons deep within the carpal tunnel for a total of 1 cc kenalog 40, 5 cc 1% lidocaine without epinephrine. Completed without difficulty  Advised to call if fevers/chills, erythema, induration, drainage, or persistent bleeding.  Images permanently stored and available for review in PACS.  Impression: Technically successful ultrasound guided median nerve hydrodissection.  Independent interpretation of notes and tests performed by another provider:   None.  Brief History, Exam, Impression, and Recommendations:    Carpal tunnel syndrome Previous right-sided median nerve hydrodissection was in May 2021, repeated today, return to see me as needed.    ___________________________________________ [name]. Dianah Field, M.D., ABFM., CAQSM. Primary Care and [place] Instructor of [place] of [facility] of Medicine

## 2020-06-07 NOTE — Assessment & Plan Note (Signed)
Previous right-sided median nerve hydrodissection was in May 2021, repeated today, return to see me as needed.

## 2020-06-09 ENCOUNTER — Other Ambulatory Visit: Payer: Self-pay | Admitting: Sports Medicine

## 2020-06-09 DIAGNOSIS — E669 Obesity, unspecified: Secondary | ICD-10-CM

## 2020-06-28 ENCOUNTER — Encounter: Payer: Self-pay | Admitting: Sports Medicine

## 2020-06-30 ENCOUNTER — Ambulatory Visit: Payer: Medicare Other | Admitting: Gastroenterology

## 2020-10-28 ENCOUNTER — Other Ambulatory Visit: Payer: Self-pay

## 2020-10-28 ENCOUNTER — Ambulatory Visit (INDEPENDENT_AMBULATORY_CARE_PROVIDER_SITE_OTHER): Payer: Medicare Other | Admitting: Sports Medicine

## 2020-10-28 ENCOUNTER — Other Ambulatory Visit: Payer: Self-pay | Admitting: Sports Medicine

## 2020-10-28 ENCOUNTER — Ambulatory Visit (INDEPENDENT_AMBULATORY_CARE_PROVIDER_SITE_OTHER): Payer: Medicare Other

## 2020-10-28 DIAGNOSIS — M1812 Unilateral primary osteoarthritis of first carpometacarpal joint, left hand: Secondary | ICD-10-CM | POA: Diagnosis not present

## 2020-10-28 DIAGNOSIS — I10 Essential (primary) hypertension: Secondary | ICD-10-CM

## 2020-10-28 DIAGNOSIS — E1169 Type 2 diabetes mellitus with other specified complication: Secondary | ICD-10-CM

## 2020-10-28 MED ORDER — DICLOFENAC SODIUM 1 % EX GEL: 2.0000 g | Freq: Four times a day (QID) | CUTANEOUS | 11 refills | Status: DC

## 2020-10-28 NOTE — Assessment & Plan Note (Signed)
Persistent pain at the left first [facility], failed oral analgesics, thumb loop, today we injected her first [place]. Return to see me in a month.

## 2020-10-28 NOTE — Progress Notes (Signed)
    Procedures performed today:    Procedure: Real-time Ultrasound Guided injection of the left first [facility] Device: Samsung HS60  Verbal informed consent obtained.  Time-out conducted.  Noted no overlying erythema, induration, or other signs of local infection.  Skin prepped in a sterile fashion.  Local anesthesia: Topical Ethyl chloride.  With sterile technique and under real time ultrasound guidance:  Noted arthritic CMC, 1/2 cc kenalog 40, 1/2 cc lidocaine injected easily.   Completed without difficulty  Advised to call if fevers/chills, erythema, induration, drainage, or persistent bleeding.  Images permanently stored and available for review in PACS.  Impression: Technically successful ultrasound guided injection.  Independent interpretation of notes and tests performed by another provider:   None.  Brief History, Exam, Impression, and Recommendations:    Primary osteoarthritis of first carpometacarpal joint of left hand Persistent pain at the left first [place], failed oral analgesics, thumb loop, today we injected her first [address]. Return to see me in a month.    ___________________________________________ [name]. Dianah Field, M.D., ABFM., CAQSM. Primary Care and [place] Instructor of [place] of [facility] of Medicine

## 2020-10-31 ENCOUNTER — Other Ambulatory Visit: Payer: Self-pay | Admitting: Sports Medicine

## 2020-10-31 DIAGNOSIS — M1812 Unilateral primary osteoarthritis of first carpometacarpal joint, left hand: Secondary | ICD-10-CM

## 2021-01-18 ENCOUNTER — Ambulatory Visit (INDEPENDENT_AMBULATORY_CARE_PROVIDER_SITE_OTHER): Payer: Medicare Other

## 2021-01-18 ENCOUNTER — Ambulatory Visit (INDEPENDENT_AMBULATORY_CARE_PROVIDER_SITE_OTHER): Payer: Medicare Other | Admitting: Sports Medicine

## 2021-01-18 DIAGNOSIS — G5603 Carpal tunnel syndrome, bilateral upper limbs: Secondary | ICD-10-CM

## 2021-01-18 NOTE — Assessment & Plan Note (Signed)
Increasing carpal tunnel symptoms, previous median nerve Hydro dissection was in November 2021, repeated today, exacerbation of a chronic process with pharmacologic management.

## 2021-01-18 NOTE — Progress Notes (Signed)
    Procedures performed today:    Procedure: Real-time Ultrasound Guided hydrodissection of the right median nerve at the carpal tunnel Device: Samsung HS60 Verbal informed consent obtained.  Time-out conducted.  Noted no overlying erythema, induration, or other signs of local infection.  Skin prepped in a sterile fashion.  Local anesthesia: Topical Ethyl chloride.  With sterile technique and under real time ultrasound guidance: Noticed enlarged median nerve.  Using a 25-gauge needle advanced into the carpal tunnel, taking care to avoid intraneural injection I injected medication both superficial to and deep to the median nerve freeing it from surrounding structures, I then redirected the needle deep and injected further medication around the flexor tendons deep within the carpal tunnel for a total of 1 cc kenalog 40, 5 cc 1% lidocaine without epinephrine. Completed without difficulty  Advised to call if fevers/chills, erythema, induration, drainage, or persistent bleeding.  Images permanently stored and available for review in PACS.  Impression: Technically successful ultrasound guided median nerve hydrodissection.  Independent interpretation of notes and tests performed by another provider:   None.  Brief History, Exam, Impression, and Recommendations:    Carpal tunnel syndrome Increasing carpal tunnel symptoms, previous median nerve Hydro dissection was in November 2021, repeated today, exacerbation of a chronic process with pharmacologic management.    ___________________________________________ [name]. Dianah Field, M.D., ABFM., CAQSM. Primary Care and [place] Instructor of [place] of [facility] of Medicine

## 2021-02-18 ENCOUNTER — Ambulatory Visit (INDEPENDENT_AMBULATORY_CARE_PROVIDER_SITE_OTHER): Payer: Medicare Other | Admitting: Family Medicine

## 2021-02-18 ENCOUNTER — Telehealth: Payer: Self-pay | Admitting: Sports Medicine

## 2021-02-18 DIAGNOSIS — H8111 Benign paroxysmal vertigo, right ear: Secondary | ICD-10-CM | POA: Diagnosis not present

## 2021-02-18 MED ORDER — AMOXICILLIN 500 MG PO CAPS: 500.0000 mg | ORAL_CAPSULE | Freq: Two times a day (BID) | ORAL | 0 refills | Status: AC

## 2021-02-18 MED ORDER — PREDNISONE 10 MG (21) PO TBPK: ORAL_TABLET | ORAL | 0 refills | Status: DC

## 2021-02-18 MED ORDER — MECLIZINE HCL 25 MG PO TABS: 25.0000 mg | ORAL_TABLET | Freq: Three times a day (TID) | ORAL | 0 refills | Status: DC | PRN

## 2021-02-18 NOTE — Telephone Encounter (Signed)
Patient calling in wanting to see if you would work her in. She said that you would know what is going on with her. She cant walk, really dizzy and she is really scared. Offered appointments with other providers and patient declined. She wanted me to send you a message to see if there is any way you can work her in. Please advise.

## 2021-02-18 NOTE — Telephone Encounter (Signed)
I am excessively booked today, there are several providers here with openings, if it is worrisome enough to her she will just see one of the providers that actually has an opening.

## 2021-02-18 NOTE — Telephone Encounter (Signed)
Patient made an appointment for today no further questions at this time.

## 2021-02-18 NOTE — Patient Instructions (Addendum)
Benign Positional Vertigo Vertigo is the feeling that you or your surroundings are moving when they are not. Benign positional vertigo is the most common form of vertigo. This is usually a harmless condition (benign). This condition is positional. This means that symptoms are triggered bycertain movements and positions. This condition can be dangerous if it occurs while you are doing something that could cause harm to yourself or others. This includes activities such asdriving or operating machinery. What are the causes? The inner ear has fluid-filled canals that help your brain sense movement and balance. When the fluid moves, the brain receives messages about your body'sposition. With benign positional vertigo, calcium crystals in the inner ear break free and disturb the inner ear area. This causes your brain to receive confusingmessages about your body's position. What increases the risk? You are more likely to develop this condition if: You are a woman. You are 31 years of age or older. You have recently had a head injury. You have an inner ear disease. What are the signs or symptoms? Symptoms of this condition usually happen when you move your head or your eyes in different directions. Symptoms may start suddenly and usually last for less than a minute. They include: Loss of balance and falling. Feeling like you are spinning or moving. Feeling like your surroundings are spinning or moving. Nausea and vomiting. Blurred vision. Dizziness. Involuntary eye movement (nystagmus). Symptoms can be mild and cause only minor problems, or they can be severe and interfere with daily life. Episodes of benign positional vertigo may return (recur) over time. Symptoms may also improve over time. How is this diagnosed? This condition may be diagnosed based on: Your medical history. A physical exam of the head, neck, and ears. Positional tests to check for or stimulate vertigo. You may be asked to turn  your head and change positions, such as going from sitting to lying down. A health care provider will watch for symptoms of vertigo. You may be referred to a health care provider who specializes in ear, nose, and throat problems (ENT or otolaryngologist) or a provider who specializes in disorders of the nervous system (neurologist). How is this treated?  This condition may be treated in a session in which your health care provider moves your head in specific positions to help the displaced crystals in your inner ear move. Treatment for this condition may take several sessions. Surgerymay be needed in severe cases, but this is rare. In some cases, benign positional vertigo may resolve on its own in 2-4 weeks. Follow these instructions at home: Safety Move slowly. Avoid sudden body or head movements or certain positions, as told by your health care provider. Avoid driving or operating machinery until your health care provider says it is safe. Avoid doing any tasks that would be dangerous to you or others if vertigo occurs. If you have trouble walking or keeping your balance, try using a cane for stability. If you feel dizzy or unstable, sit down right away. Return to your normal activities as told by your health care provider. Ask your health care provider what activities are safe for you. General instructions Take over-the-counter and prescription medicines only as told by your health care provider. Drink enough fluid to keep your urine pale yellow. Keep all follow-up visits. This is important. Contact a health care provider if: You have a fever. Your condition gets worse or you develop new symptoms. Your family or friends notice any behavioral changes. You have nausea or vomiting  that gets worse. You have numbness or a prickling and tingling sensation. Get help right away if you: Have difficulty speaking or moving. Are always dizzy or faint. Develop severe headaches. Have weakness in your  legs or arms. Have changes in your hearing or vision. Develop a stiff neck. Develop sensitivity to light. These symptoms may represent a serious problem that is an emergency. Do not wait to see if the symptoms will go away. Get medical help right away. Call your local emergency services (911 in the [place] Do not drive yourself to the hospital. Summary Vertigo is the feeling that you or your surroundings are moving when they are not. Benign positional vertigo is the most common form of vertigo. This condition is caused by calcium crystals in the inner ear that become displaced. This causes a disturbance in an area of the inner ear that helps your brain sense movement and balance. Symptoms include loss of balance and falling, feeling that you or your surroundings are moving, nausea and vomiting, and blurred vision. This condition can be diagnosed based on symptoms, a physical exam, and positional tests. Follow safety instructions as told by your health care provider and keep all follow-up visits. This is important. This information is not intended to replace advice given to you by your health care provider. Make sure you discuss any questions you have with your healthcare provider. Document Revised: 06/16/2020 Document Reviewed: 06/16/2020 Elsevier Patient Education  2022 [employer].   How to Perform the Epley Maneuver The Epley maneuver is an exercise that relieves symptoms of vertigo. Vertigo is the feeling that you or your surroundings are moving when they are not. When you feel vertigo, you may feel like the room is spinning and may have trouble walking. The Epley maneuver is used for a type of vertigo caused by a calcium deposit in a part of the inner ear. The maneuver involves changing headpositions to help the deposit move out of the area. You can do this maneuver at home whenever you have symptoms of vertigo. You canrepeat it in 24 hours if your vertigo has not gone away. Even though the  Epley maneuver may relieve your vertigo for a few weeks, it is possible that your symptoms will return. This maneuver relieves vertigo, but itdoes not relieve dizziness. What are the risks? If it is done correctly, the Epley maneuver is considered safe. Sometimes it can lead to dizziness or nausea that goes away after a short time. If you develop other symptoms--such as changes in vision, weakness, or numbness--stopdoing the maneuver and call your health care provider. Supplies needed: A bed or table. A pillow. How to do the Epley maneuver     Sit on the edge of a bed or table with your back straight and your legs extended or hanging over the edge of the bed or table. Turn your head halfway toward the affected ear or side as told by your health care provider. Lie backward quickly with your head turned until you are lying flat on your back. Your head should dangle (head-hanging position). You may want to position a pillow under your shoulders. Hold this position for at least 30 seconds. If you feel dizzy or have symptoms of vertigo, continue to hold the position until the symptoms stop. Turn your head to the opposite direction until your unaffected ear is facing down. Your head should continue to dangle. Hold this position for at least 30 seconds. If you feel dizzy or have symptoms of vertigo,  continue to hold the position until the symptoms stop. Turn your whole body to the same side as your head so that you are positioned on your side. Your head will now be nearly facedown and no longer needs to dangle. Hold for at least 30 seconds. If you feel dizzy or have symptoms of vertigo, continue to hold the position until the symptoms stop. Sit back up. You can repeat the maneuver in 24 hours if your vertigo does not go away. Follow these instructions at home: For 24 hours after doing the Epley maneuver: Keep your head in an upright position. When lying down to sleep or rest, keep your head raised  (elevated) with two or more pillows. Avoid excessive neck movements. Activity Do not drive or use machinery if you feel dizzy. After doing the Epley maneuver, return to your normal activities as told by your health care provider. Ask your health care provider what activities are safe for you. General instructions Drink enough fluid to keep your urine pale yellow. Do not drink alcohol. Take over-the-counter and prescription medicines only as told by your health care provider. Keep all follow-up visits. This is important. Preventing vertigo symptoms Ask your health care provider if there is anything you should do at home to prevent vertigo. He or she may recommend that you: Keep your head elevated with two or more pillows while you sleep. Do not sleep on the side of your affected ear. Get up slowly from bed. Avoid sudden movements during the day. Avoid extreme head positions or movement, such as looking up or bending over. Contact a health care provider if: Your vertigo gets worse. You have other symptoms, including: Nausea. Vomiting. Headache. Get help right away if you: Have vision changes. Have a headache or neck pain that is severe or getting worse. Cannot stop vomiting. Have new numbness or weakness in any part of your body. These symptoms may represent a serious problem that is an emergency. Do not wait to see if the symptoms will go away. Get medical help right away. Call your local emergency services (911 in the [place] Do not drive yourself to the hospital. Summary Vertigo is the feeling that you or your surroundings are moving when they are not. The Epley maneuver is an exercise that relieves symptoms of vertigo. If the Epley maneuver is done correctly, it is considered safe. This information is not intended to replace advice given to you by your health care provider. Make sure you discuss any questions you have with your healthcare provider. Document Revised: 06/16/2020  Document Reviewed: 06/16/2020 Elsevier Patient Education  2022 [employer].

## 2021-02-20 ENCOUNTER — Encounter: Payer: Self-pay | Admitting: Family Medicine

## 2021-02-20 DIAGNOSIS — H8110 Benign paroxysmal vertigo, unspecified ear: Secondary | ICD-10-CM | POA: Insufficient documentation

## 2021-02-20 NOTE — Progress Notes (Signed)
Tracey Marshall - 70 y.o. female MRN [id number]  Date of birth: 12-29-1950  Subjective Chief Complaint  Patient presents with   Dizziness    HPI Tracey Marshall is a 70 year old female here today with complaint of dizziness.  She reports the dizziness started just this morning.  She felt fine when she went to bed last night.  Dizziness is worse with sudden movements of her head as well as sitting up or standing up.  She does feel fullness feeling to her right ear.  She has had a an infected tooth in her right upper jaw and is planning on seeing a dentist for this soon.  She denies any headaches, nausea, vomiting, fever, chills.  She also denies any other neurological symptoms including weakness, numbness, tingling, facial droop, slurred speech.    ROS:  A comprehensive ROS was completed and negative except as noted per HPI  Allergies  Allergen Reactions   Nickel Rash   Influenza Vaccines     Past Medical History:  Diagnosis Date   Hyperlipidemia    Hypertension     Past Surgical History:  Procedure Laterality Date   CESAREAN SECTION     CORONARY ARTERY BYPASS GRAFT      Social History   Socioeconomic History   Marital status: Married    Spouse name: Not on file   Number of children: Not on file   Years of education: Not on file   Highest education level: Not on file  Occupational History   Not on file  Tobacco Use   Smoking status: Former   Smokeless tobacco: Never  Vaping Use   Vaping Use: Never used  Substance and Sexual Activity   Alcohol use: No    Alcohol/week: 0.0 standard drinks   Drug use: No   Sexual activity: Never  Other Topics Concern   Not on file  Social History Narrative   Not on file   Social Determinants of Health   Financial Resource Strain: Not on file  Food Insecurity: Not on file  Transportation Needs: Not on file  Physical Activity: Not on file  Stress: Not on file  Social Connections: Not on file    Family History  Problem Relation Age of  Onset   Diabetes Sister    Healthy Mother    Healthy Father    Cancer Neg Hx    Hypertension Neg Hx     Health Maintenance  Topic Date Due   Zoster Vaccines- Shingrix (1 of 2) Never done   OPHTHALMOLOGY EXAM  11/28/2017   FOOT EXAM  01/24/2019   HEMOGLOBIN A1C  04/25/2020   COVID-19 Vaccine (3 - Pfizer risk series) 06/08/2020   MAMMOGRAM  10/09/2021   TETANUS/TDAP  06/09/2025   COLONOSCOPY (Pts 45-60yr Insurance coverage will need to be confirmed)  08/30/2025   DEXA SCAN  Completed   Hepatitis C Screening  Completed   PNA vac Low Risk Adult  Completed   HPV VACCINES  Aged Out   INFLUENZA VACCINE  Discontinued     ----------------------------------------------------------------------------------------------------------------------------------------------------------------------------------------------------------------- Physical Exam BP 138/64   Pulse (!) 55   Wt 153 lb (69.4 kg)   SpO2 97%   BMI 33.11 kg/m   Physical Exam Constitutional:      Appearance: Normal appearance.  HENT:     Head: Normocephalic and atraumatic.     Right Ear: Ear canal and external ear normal.     Left Ear: Tympanic membrane, ear canal and external ear normal.     Ears:  Comments: Small amount of fluid behind right TM.  This appears to be clear. Neurological:     General: No focal deficit present.     Mental Status: She is alert.     Cranial Nerves: No cranial nerve deficit.     Sensory: No sensory deficit.     Motor: No weakness.     Gait: Gait normal.     Comments: Mildly positive Dix-Hallpike to the left.  Significantly positive right Dix-Hallpike test with rotational nystagmus.  Psychiatric:        Mood and Affect: Mood normal.        Behavior: Behavior normal.     ------------------------------------------------------------------------------------------------------------------------------------------------------------------------------------------------------------------- Assessment and Plan  Benign positional vertigo Her symptoms and exam are consistent with benign positional vertigo.  I have low  suspicion for more nefarious process such as stroke or tumor.  I would not treat her dental infection with a course of amoxicillin and add a course of prednisone eustachian tube dysfunction and/or labyrinthitis.  Adding meclizine as needed for symptom control.  Instructed to call for new or worsening symptoms.  If not improving with this we can consider referral for vestibular rehab.   Meds ordered this encounter  Medications   meclizine (ANTIVERT) 25 MG tablet    Sig: Take 1 tablet (25 mg total) by mouth 3 (three) times daily as needed for dizziness.    Dispense:  30 tablet    Refill:  0   amoxicillin (AMOXIL) 500 MG capsule    Sig: Take 1 capsule (500 mg total) by mouth 2 (two) times daily for 10 days.    Dispense:  20 capsule    Refill:  0   predniSONE (STERAPRED UNI-PAK 21 TAB) 10 MG (21) TBPK tablet    Sig: Taper as directed on packaging    Dispense:  21 tablet    Refill:  0    No follow-ups on file.    This visit occurred during the SARS-CoV-2 public health emergency.  Safety protocols were in place, including screening questions prior to the visit, additional usage of staff PPE, and extensive cleaning of exam room while observing appropriate contact time as indicated for disinfecting solutions.

## 2021-02-20 NOTE — Assessment & Plan Note (Signed)
Her symptoms and exam are consistent with benign positional vertigo.  I have low  suspicion for more nefarious process such as stroke or tumor.  I would not treat her dental infection with a course of amoxicillin and add a course of prednisone eustachian tube dysfunction and/or labyrinthitis.  Adding meclizine as needed for symptom control.  Instructed to call for new or worsening symptoms.  If not improving with this we can consider referral for vestibular rehab.

## 2021-03-08 ENCOUNTER — Other Ambulatory Visit: Payer: Self-pay | Admitting: Sports Medicine

## 2021-05-12 ENCOUNTER — Other Ambulatory Visit: Payer: Self-pay | Admitting: Sports Medicine

## 2021-05-12 DIAGNOSIS — E669 Obesity, unspecified: Secondary | ICD-10-CM

## 2021-05-13 ENCOUNTER — Ambulatory Visit (INDEPENDENT_AMBULATORY_CARE_PROVIDER_SITE_OTHER): Payer: Medicare Other

## 2021-05-13 ENCOUNTER — Ambulatory Visit (INDEPENDENT_AMBULATORY_CARE_PROVIDER_SITE_OTHER): Payer: Medicare Other | Admitting: Sports Medicine

## 2021-05-13 ENCOUNTER — Other Ambulatory Visit: Payer: Self-pay | Admitting: Sports Medicine

## 2021-05-13 DIAGNOSIS — E669 Obesity, unspecified: Secondary | ICD-10-CM

## 2021-05-13 DIAGNOSIS — E1169 Type 2 diabetes mellitus with other specified complication: Secondary | ICD-10-CM

## 2021-05-13 DIAGNOSIS — G5603 Carpal tunnel syndrome, bilateral upper limbs: Secondary | ICD-10-CM

## 2021-05-13 DIAGNOSIS — L219 Seborrheic dermatitis, unspecified: Secondary | ICD-10-CM | POA: Diagnosis not present

## 2021-05-13 MED ORDER — FLUOCINONIDE 0.05 % EX SOLN: 1.0000 "application " | Freq: Two times a day (BID) | CUTANEOUS | 11 refills | Status: DC

## 2021-05-13 NOTE — Assessment & Plan Note (Signed)
Delinquent on multiple modalities, getting routine labs, referral for eye exam, foot exam today.

## 2021-05-13 NOTE — Progress Notes (Signed)
    Procedures performed today:    Procedure: Real-time Ultrasound Guided hydrodissection of the left median nerve at the carpal tunnel Device: Samsung HS60 Verbal informed consent obtained.  Time-out conducted.  Noted no overlying erythema, induration, or other signs of local infection.  Skin prepped in a sterile fashion.  Local anesthesia: Topical Ethyl chloride.  With sterile technique and under real time ultrasound guidance: Noted large nerve.  Using a 25-gauge needle advanced into the carpal tunnel, taking care to avoid intraneural injection I injected medication both superficial to and deep to the median nerve freeing it from surrounding structures, I then redirected the needle deep and injected further medication around the flexor tendons deep within the carpal tunnel for a total of 1 cc kenalog 40, 5 cc 1% lidocaine without epinephrine. Completed without difficulty  Advised to call if fevers/chills, erythema, induration, drainage, or persistent bleeding.  Images permanently stored and available for review in PACS.  Impression: Technically successful ultrasound guided median nerve hydrodissection.  Independent interpretation of notes and tests performed by another provider:   None.  Brief History, Exam, Impression, and Recommendations:    Carpal tunnel syndrome Increasing carpal tunnel symptoms, repeat left median nerve Hydro dissection, last done in April..  Diabetes mellitus type 2 in obese [facility]) Delinquent on multiple modalities, getting routine labs, referral for eye exam, foot exam today.  Seborrheic dermatitis of scalp Seborrheic dermatitis of the scalp, apply fluocinonide ointment twice daily.    ___________________________________________ [name]. Dianah Field, M.D., ABFM., CAQSM. Primary Care and [place] Instructor of [place] [employer] [facility]

## 2021-05-13 NOTE — Assessment & Plan Note (Signed)
Seborrheic dermatitis of the scalp, apply fluocinonide ointment twice daily.

## 2021-05-13 NOTE — Assessment & Plan Note (Signed)
Increasing carpal tunnel symptoms, repeat left median nerve Hydro dissection, last done in April.Marland Kitchen

## 2021-05-14 LAB — COMPREHENSIVE METABOLIC PANEL
AG Ratio: 1.6 (calc) (ref 1.0–2.5)
ALT: 31 U/L — ABNORMAL HIGH (ref 6–29)
AST: 29 U/L (ref 10–35)
Albumin: 4.1 g/dL (ref 3.6–5.1)
Alkaline phosphatase (APISO): 54 U/L (ref 37–153)
BUN/Creatinine Ratio: 17 (calc) (ref 6–22)
BUN: 19 mg/dL (ref 7–25)
CO2: 23 mmol/L (ref 20–32)
Calcium: 9.2 mg/dL (ref 8.6–10.4)
Chloride: 110 mmol/L (ref 98–110)
Creat: 1.15 mg/dL — ABNORMAL HIGH (ref 0.60–1.00)
Globulin: 2.5 g/dL (calc) (ref 1.9–3.7)
Glucose, Bld: 179 mg/dL — ABNORMAL HIGH (ref 65–139)
Potassium: 4.4 mmol/L (ref 3.5–5.3)
Sodium: 142 mmol/L (ref 135–146)
Total Bilirubin: 0.4 mg/dL (ref 0.2–1.2)
Total Protein: 6.6 g/dL (ref 6.1–8.1)

## 2021-05-14 LAB — LIPID PANEL
Cholesterol: 100 mg/dL (ref <200)
HDL: 48 mg/dL — ABNORMAL LOW (ref >=50)
LDL Cholesterol (Calc): 33 mg/dL (calc)
Non-HDL Cholesterol (Calc): 52 mg/dL (calc) (ref <130)
Total CHOL/HDL Ratio: 2.1 (calc) (ref <5.0)
Triglycerides: 100 mg/dL (ref <150)

## 2021-05-14 LAB — CBC
HCT: 41.2 % (ref 35.0–45.0)
Hemoglobin: 13.4 g/dL (ref 11.7–15.5)
MCH: 30.7 pg (ref 27.0–33.0)
MCHC: 32.5 g/dL (ref 32.0–36.0)
MCV: 94.3 fL (ref 80.0–100.0)
MPV: 9.8 fL (ref 7.5–12.5)
Platelets: 266 10*3/uL (ref 140–400)
RBC: 4.37 10*6/uL (ref 3.80–5.10)
RDW: 12.3 % (ref 11.0–15.0)
WBC: 8.3 10*3/uL (ref 3.8–10.8)

## 2021-05-14 LAB — HEMOGLOBIN A1C
Hgb A1c MFr Bld: 6.1 % of total Hgb — ABNORMAL HIGH (ref <5.7)
Mean Plasma Glucose: 128 mg/dL
eAG (mmol/L): 7.1 mmol/L

## 2021-05-14 LAB — TSH: TSH: 2.27 mIU/L (ref 0.40–4.50)

## 2021-05-18 LAB — HM DIABETES EYE EXAM

## 2021-06-25 ENCOUNTER — Other Ambulatory Visit: Payer: Self-pay | Admitting: Sports Medicine

## 2021-06-25 DIAGNOSIS — E669 Obesity, unspecified: Secondary | ICD-10-CM

## 2021-07-18 ENCOUNTER — Other Ambulatory Visit: Payer: Self-pay

## 2021-07-18 DIAGNOSIS — M1812 Unilateral primary osteoarthritis of first carpometacarpal joint, left hand: Secondary | ICD-10-CM

## 2021-07-18 MED ORDER — DICLOFENAC SODIUM 1 % EX GEL: 2.0000 g | Freq: Four times a day (QID) | CUTANEOUS | 7 refills | Status: DC

## 2021-07-22 ENCOUNTER — Ambulatory Visit (INDEPENDENT_AMBULATORY_CARE_PROVIDER_SITE_OTHER): Payer: Medicare Other | Admitting: Sports Medicine

## 2021-07-22 ENCOUNTER — Other Ambulatory Visit: Payer: Self-pay

## 2021-07-22 VITALS — BP 145/59 | HR 60 | Ht <= 58 in | Wt 158.0 lb

## 2021-07-22 DIAGNOSIS — Z0000 Encounter for general adult medical examination without abnormal findings: Secondary | ICD-10-CM | POA: Diagnosis not present

## 2021-07-22 DIAGNOSIS — M810 Age-related osteoporosis without current pathological fracture: Secondary | ICD-10-CM

## 2021-07-22 DIAGNOSIS — Z780 Asymptomatic menopausal state: Secondary | ICD-10-CM | POA: Diagnosis not present

## 2021-07-22 NOTE — Patient Instructions (Addendum)
[place] Maintenance Summary and Written Plan of Care  Tracey Marshall ,  Thank you for allowing me to perform your Medicare Annual Wellness Visit and for your ongoing commitment to your health.   Health Maintenance & Immunization History Health Maintenance  Topic Date Due   Zoster Vaccines- Shingrix (1 of 2) 08/13/2021 (Originally 06/20/1970)   INFLUENZA VACCINE  10/28/2021 (Originally 02/28/2021)   HEMOGLOBIN A1C  11/11/2021   FOOT EXAM  05/13/2022   OPHTHALMOLOGY EXAM  05/18/2022   MAMMOGRAM  10/16/2022   TETANUS/TDAP  06/09/2025   COLONOSCOPY (Pts 45-48yrs Insurance coverage will need to be confirmed)  08/30/2025   Pneumonia Vaccine 34+ Years old  Completed   DEXA SCAN  Completed   Hepatitis C Screening  Completed   HPV VACCINES  Aged Out   COVID-19 Vaccine  Discontinued   Immunization History  Administered Date(s) Administered   PFIZER(Purple Top)SARS-COV-2 Vaccination 04/19/2020, 04/19/2020, 05/11/2020   PPD Test 10/31/2016, 05/15/2017, 08/09/2020   Pneumococcal Conjugate-13 06/10/2015   Pneumococcal Polysaccharide-23 12/12/2016   Tdap 06/10/2015    These are the patient goals that we discussed:  Goals Addressed               This Visit's Progress     Patient Stated (pt-stated)        07/22/2021 AWV Goal: Exercise for General Health  Patient will verbalize understanding of the benefits of increased physical activity: Exercising regularly is important. It will improve your overall fitness, flexibility, and endurance. Regular exercise also will improve your overall health. It can help you control your weight, reduce stress, and improve your bone density. Over the next year, patient will increase physical activity as tolerated with a goal of at least 150 minutes of moderate physical activity per week.  You can tell that you are exercising at a moderate intensity if your heart starts beating faster and you start breathing faster but can still  hold a conversation. Moderate-intensity exercise ideas include: Walking 1 mile (1.6 km) in about 15 minutes Biking Hiking Golfing Dancing Water aerobics Patient will verbalize understanding of everyday activities that increase physical activity by providing examples like the following: Yard work, such as: Sales promotion account executive Gardening Washing windows or floors Patient will be able to explain general safety guidelines for exercising:  Before you start a new exercise program, talk with your health care provider. Do not exercise so much that you hurt yourself, feel dizzy, or get very short of breath. Wear comfortable clothes and wear shoes with good support. Drink plenty of water while you exercise to prevent dehydration or heat stroke. Work out until your breathing and your heartbeat get faster.          This is a list of Health Maintenance Items that are overdue or due now: Influenza vaccine Screening mammography Bone densitometry screening Shingrix  Patient declined the vaccines at this time. Mammogram was completed at [facility] in March, 2022.  Orders/Referrals Placed Today: Orders Placed This Encounter  Procedures   DEXAScan    Standing Status:   Future    Standing Expiration Date:   07/22/2022    Scheduling Instructions:     Please call patient to schedule. She would like an early appointment.    Order Specific Question:   Reason for exam:    Answer:   post menopausal    Order Specific Question:   Preferred imaging location?  Answer:   [facility]   (Contact our referral department at [phone] if you have not spoken with someone about your referral appointment within the next 5 days)    Follow-up Plan Follow-up with Silverio Decamp, MD as planned Referral for bone density was sent and they will call you to schedule. Medicare wellness visit in one year. AVS  printed and given to the patient.

## 2021-07-22 NOTE — Progress Notes (Signed)
MEDICARE ANNUAL WELLNESS VISIT  07/22/2021  Subjective:  Tracey Marshall is a 70 y.o. female patient of Thekkekandam, [name], MD who had a Medicare Annual Wellness Visit today. Tracey Marshall is Working part time and lives with their spouse. she has 4 children. she reports that she is socially active and does interact with friends/family regularly. she is minimally physically active and enjoys spending time with her friends.  Patient Care Team: Silverio Decamp, MD as PCP - General (Family Medicine)  Advanced Directives 07/22/2021 06/11/2015  Does Patient Have a Medical Advance Directive? Yes No  Type of Advance Directive Living will;Healthcare Power of Attorney -  Does patient want to make changes to medical advance directive? No - Patient declined -  Copy of [place] in Chart? No - copy requested -  Would patient like information on creating a medical advance directive? - No - patient declined information    Hospital Utilization Over the Past 12 Months: # of hospitalizations or ER visits: 0 # of surgeries: 0  Review of Systems    Patient reports that her overall health is unchanged when compared to last year.  Review of Systems: History obtained from chart review and the patient  All other systems negative.  Pain Assessment Pain : No/denies pain     Current Medications & Allergies (verified) Allergies as of 07/22/2021       Reactions   Nickel Rash   Influenza Vaccines         Medication List        Accurate as of July 22, 2021  8:36 AM. If you have any questions, ask your nurse or doctor.          alendronate 70 MG tablet Commonly known as: FOSAMAX TAKE 1 TABLET BY MOUTH  WEEKLY WITH 8 OZ OF PLAIN  WATER 30 MINUTES BEFORE  FIRST FOOD, DRINK OR MEDS.  STAY UPRIGHT FOR 30 MINS   aspirin EC 81 MG tablet Take 1 tablet (81 mg total) by mouth daily.   Calcium-Phosphorus-Vitamin D [phone] MG-MG-UNIT Chew Chew by mouth.   carvedilol  3.125 MG tablet Commonly known as: COREG TAKE 1 TABLET BY MOUTH  TWICE DAILY WITH MEALS   diclofenac Sodium 1 % Gel Commonly known as: VOLTAREN Apply 2 g topically 4 (four) times daily. To affected joint.   ezetimibe 10 MG tablet Commonly known as: ZETIA Take 10 mg by mouth daily.   fluocinonide 0.05 % external solution Commonly known as: LIDEX Apply 1 application topically 2 (two) times daily. To scalp   fluticasone 50 MCG/ACT nasal spray Commonly known as: FLONASE USE 1 SPRAY IN EACH NOSTRIL 2 TIMES A DAY USE LEFT HAND FOR RIGHT NOSTRIL AND RIGHT HAND FOR LEFT NOSTRIL.   lisinopril 40 MG tablet Commonly known as: ZESTRIL TAKE 1 TABLET BY MOUTH  DAILY   meclizine 25 MG tablet Commonly known as: ANTIVERT Take 1 tablet (25 mg total) by mouth 3 (three) times daily as needed for dizziness.   meloxicam 15 MG tablet Commonly known as: MOBIC TAKE 1 TABLET BY MOUTH IN  THE MORNING WITH A MEAL FOR 2 WEEKS, THEN DAILY AS  NEEDED FOR PAIN   omeprazole 40 MG capsule Commonly known as: PRILOSEC Take 1 capsule (40 mg total) by mouth 2 (two) times daily. Take in the morning and at dinnertime.   omeprazole 20 MG capsule Commonly known as: PRILOSEC TAKE 1 CAPSULE BY MOUTH  DAILY   predniSONE 10 MG (21) Tbpk tablet Commonly  known as: STERAPRED UNI-PAK 21 TAB Taper as directed on packaging   rosuvastatin 40 MG tablet Commonly known as: CRESTOR Take by mouth.   topiramate 50 MG tablet Commonly known as: TOPAMAX TAKE 1 TABLET BY MOUTH  DAILY   valACYclovir 1000 MG tablet Commonly known as: VALTREX Take 1 tablet (1,000 mg total) by mouth 2 (two) times daily.        History (reviewed): Past Medical History:  Diagnosis Date   Allergy    Arthritis    CHF (congestive heart failure) (HCC)    Emphysema of lung (HCC)    Hyperlipidemia    Hypertension    Past Surgical History:  Procedure Laterality Date   BREAST SURGERY     CESAREAN SECTION     CORONARY ARTERY BYPASS  GRAFT     Family History  Problem Relation Age of Onset   Diabetes Sister    Healthy Mother    Healthy Father    Cancer Neg Hx    Hypertension Neg Hx    Social History   Socioeconomic History   Marital status: Married    Spouse name: Tracey Marshall   Number of children: 4   Years of education: 12   Highest education level: GED or equivalent  Occupational History   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  Vaping Use   Vaping Use: Never used  Substance and Sexual Activity   Alcohol use: No   Drug use: No   Sexual activity: Never  Other Topics Concern   Not on file  Social History Narrative   Lives with her husband. She still works part-time as a Quarry manager. She has four children. She enjoys spending time with her friends.   Social Determinants of Health   Financial Resource Strain: Low Risk    Difficulty of Paying Living Expenses: Not hard at all  Food Insecurity: No Food Insecurity   Worried About Charity fundraiser in the Last Year: Never true   [place] in the Last Year: Never true  Transportation Needs: No Transportation Needs   Lack of Transportation (Medical): No   Lack of Transportation (Non-Medical): No  Physical Activity: Inactive   Days of Exercise per Week: 0 days   Minutes of Exercise per Session: 0 min  Stress: No Stress Concern Present   Feeling of Stress : Not at all  Social Connections: Moderately Integrated   Frequency of Communication with Friends and Family: More than three times a week   Frequency of Social Gatherings with Friends and Family: More than three times a week   Attends Religious Services: Never   Marine scientist or Organizations: Yes   Attends Programme researcher, broadcasting/film/video: More than 4 times per year   Marital Status: Married    Activities of Daily Living In your present state of health, do you have any difficulty performing the following activities: 07/22/2021 07/18/2021  Hearing? Tracey Marshall  Comment has one hearing aids. -  Vision? N N  Difficulty concentrating or making decisions? N N  Walking or climbing stairs? N N  Dressing or bathing? N  N  Doing errands, shopping? N N  Preparing Food and eating ? N N  Using the Toilet? N N  In the past six months, have you accidently leaked urine? N N  Do you have problems with loss of bowel control? N N  Managing your Medications? N N  Managing your Finances? N N  Housekeeping or managing your Housekeeping? N N  Some recent data might be hidden    Patient Education/Literacy How often do you need to have someone help you when you read instructions, pamphlets, or other written materials from your doctor or pharmacy?: 1 - Never What is the last grade level you completed in school?: 11th grade, GED, CNA certificate  Exercise Current Exercise Habits: The patient does not participate in regular exercise at present, Exercise limited by: None identified  Diet Patient reports consuming 2 meals a day and 0 snack(s) a day Patient reports that her primary diet is: Regular Patient reports that she does have regular access to food.   Depression Screen PHQ 2/9 Scores 07/22/2021 05/13/2021 01/23/2018 12/12/2016  PHQ - 2 Score 0 0 4 0  PHQ- 9 Score - - 7 0     Fall Risk Fall Risk  07/22/2021 07/18/2021 05/13/2021  Falls in the past year? 0 0 0  Number falls in past yr: 0 0 0  Injury with Fall? 0 0 0  Risk for fall due to : No Fall Risks - -  Follow up Falls evaluation completed - -     Objective:   BP (!) 145/59 (BP Location: Right Arm, Patient Position: Sitting, Cuff Size: Normal)    Pulse 60    Ht 4\' 8"  (1.422 m)    Wt 158 lb 0.6 oz (71.7 kg)    SpO2 97%    BMI 35.43 kg/m   Last Weight  Most  recent update: 07/22/2021  8:09 AM    Weight  71.7 kg (158 lb 0.6 oz)             Body mass index is 35.43 kg/m.  Hearing/Vision  Tracey Marshall did not have difficulty with hearing/understanding during the face-to-face interview Tracey Marshall did not have difficulty with her vision during the face-to-face interview Reports that she has had a formal eye exam by an eye care professional within the past year Reports that she has had a formal hearing evaluation within the past year  Cognitive Function: 6CIT Screen 07/22/2021  What Year? 0 points  What month? 0 points  What time? 0 points  Count back from 20 0 points  Months in reverse 0 points  Repeat phrase 0 points  Total Score 0    Normal Cognitive Function Screening: Yes (Normal:0-7, Significant for Dysfunction: >8)  Immunization & Health Maintenance Record Immunization History  Administered Date(s) Administered   PFIZER(Purple Top)SARS-COV-2 Vaccination 04/19/2020, 04/19/2020, 05/11/2020   PPD Test 10/31/2016, 05/15/2017, 08/09/2020   Pneumococcal Conjugate-13 06/10/2015   Pneumococcal Polysaccharide-23 12/12/2016   Tdap 06/10/2015    Health Maintenance  Topic Date Due   Zoster Vaccines- Shingrix (1 of 2) 08/13/2021 (Originally 06/20/1970)   INFLUENZA VACCINE  10/28/2021 (Originally 02/28/2021)   HEMOGLOBIN A1C  11/11/2021   FOOT EXAM  05/13/2022   OPHTHALMOLOGY EXAM  05/18/2022   MAMMOGRAM  10/16/2022   TETANUS/TDAP  06/09/2025   COLONOSCOPY (Pts 45-50yrs Insurance coverage will need to be confirmed)  08/30/2025   Pneumonia Vaccine 64+ Years old  Completed   DEXA SCAN  Completed   Hepatitis C Screening  Completed  HPV VACCINES  Aged Out   COVID-19 Vaccine  Discontinued       Assessment  This is a routine wellness examination for Tracey Marshall.  Health Maintenance: Due or Overdue There are no preventive care reminders to display for this patient.  Tracey Marshall does not need a referral for Community Assistance: Care  Management:   no Social Work:    no Prescription Assistance:  no Nutrition/Diabetes Education:  no   Plan:  Personalized Goals  Goals Addressed               This Visit's Progress     Patient Stated (pt-stated)        07/22/2021 AWV Goal: Exercise for General Health  Patient will verbalize understanding of the benefits of increased physical activity: Exercising regularly is important. It will improve your overall fitness, flexibility, and endurance. Regular exercise also will improve your overall health. It can help you control your weight, reduce stress, and improve your bone density. Over the next year, patient will increase physical activity as tolerated with a goal of at least 150 minutes of moderate physical activity per week.  You can tell that you are exercising at a moderate intensity if your heart starts beating faster and you start breathing faster but can still hold a conversation. Moderate-intensity exercise ideas include: Walking 1 mile (1.6 km) in about 15 minutes Biking Hiking Golfing Dancing Water aerobics Patient will verbalize understanding of everyday activities that increase physical activity by providing examples like the following: Yard work, such as: Sales promotion account executive Gardening Washing windows or floors Patient will be able to explain general safety guidelines for exercising:  Before you start a new exercise program, talk with your health care provider. Do not exercise so much that you hurt yourself, feel dizzy, or get very short of breath. Wear comfortable clothes and wear shoes with good support. Drink plenty of water while you exercise to prevent dehydration or heat stroke. Work out until your breathing and your heartbeat get faster.        Personalized Health Maintenance & Screening Recommendations  Influenza vaccine Screening mammography Bone densitometry  screening Shingrix  Patient declined the vaccines at this time. Mammogram was [facility] at [facility] in March, 2022.  Lung Cancer Screening Recommended: no (Low Dose CT Chest recommended if Age 38-80 years, 30 pack-year currently smoking OR have quit w/in past 15 years) Hepatitis C Screening recommended: no HIV Screening recommended: no  Advanced Directives: Written information was not given per the patient's request.  Referrals & Orders Orders Placed This Encounter  Procedures   [place]    Follow-up Plan Follow-up with Silverio Decamp, MD as planned Referral for bone density was sent and they will call you to schedule. Medicare wellness visit in one year. AVS printed and given to the patient.   I have personally reviewed and noted the following in the patients chart:   Medical and social history Use of alcohol, tobacco or illicit drugs  Current medications and supplements Functional ability and status Nutritional status Physical activity Advanced directives List of other physicians Hospitalizations, surgeries, and ER visits in previous 12 months Vitals Screenings to include cognitive, depression, and falls Referrals and appointments  In addition, I have reviewed and discussed with patient certain preventive protocols, quality metrics, and best practice recommendations. A written personalized care plan for preventive services as well as general preventive health recommendations were provided to  patient.     Tinnie Gens, RN  07/22/2021

## 2021-08-02 ENCOUNTER — Telehealth: Payer: Self-pay | Admitting: Sports Medicine

## 2021-08-02 NOTE — Telephone Encounter (Signed)
Per patient when she received her prescription for Diclofenac Sodium she was charged over $100. Patient thought she was would not be charged. She spoke with the [facility] and was told that Dr. Darene Lamer would have to call and cancel the prescription before she is able to send it back.  Per Patient once the prescription is cancelled they will pick it up and reimburse her the money. Also another medication will be sent to her that she does not have to pay for. Please advise.

## 2021-08-02 NOTE — Telephone Encounter (Signed)
Okay, approval granted to call [facility] and cancel the prescription, also she needs to call them and find out what prescription she would not have to pay for, I do not know this information.  Once she is able to provide me the name of the medication that she would not have to pay for I am happy to send it in.

## 2021-08-03 ENCOUNTER — Other Ambulatory Visit: Payer: Self-pay

## 2021-08-03 ENCOUNTER — Ambulatory Visit (INDEPENDENT_AMBULATORY_CARE_PROVIDER_SITE_OTHER): Payer: Medicare Other

## 2021-08-03 DIAGNOSIS — Z0000 Encounter for general adult medical examination without abnormal findings: Secondary | ICD-10-CM

## 2021-08-03 DIAGNOSIS — Z780 Asymptomatic menopausal state: Secondary | ICD-10-CM | POA: Diagnosis not present

## 2021-08-03 NOTE — Assessment & Plan Note (Signed)
Bone density very low, switching from Fosamax to Prolia, ordering routine labs prior. We also need to work on approval.

## 2021-08-03 NOTE — Telephone Encounter (Signed)
Spoke with pharmacist at [facility], Rx cancelled. She stated they would send out an envelope for the patient to return the medication and then her account would be credited. Also, there are no other topical anti-inflammatories covered under her plan. The only $0 anti-inflammatory is diclofenac tablets.

## 2021-08-03 NOTE — Telephone Encounter (Signed)
Left msg for patient to return call to discuss medication problem.

## 2021-08-03 NOTE — Telephone Encounter (Signed)
She just needs to get topical Voltaren over-the-counter then.  I am happy to send in oral Voltaren if she would like but she already has meloxicam.

## 2021-08-03 NOTE — Telephone Encounter (Signed)
Patient aware that she will be mailed a return envelope and then her account will be credited. She was also informed that there is no covered meds in her plan. Patient stated that she doesn't use this medication regularly and she would just buy it over the counter when she needs it.

## 2021-08-03 NOTE — Addendum Note (Signed)
Addended by: Silverio Decamp on: 08/03/2021 04:38 PM   Modules accepted: Orders

## 2021-08-08 ENCOUNTER — Telehealth: Payer: Self-pay

## 2021-08-08 ENCOUNTER — Ambulatory Visit (INDEPENDENT_AMBULATORY_CARE_PROVIDER_SITE_OTHER): Payer: Medicare Other

## 2021-08-08 ENCOUNTER — Ambulatory Visit (INDEPENDENT_AMBULATORY_CARE_PROVIDER_SITE_OTHER): Payer: Medicare Other | Admitting: Sports Medicine

## 2021-08-08 ENCOUNTER — Other Ambulatory Visit: Payer: Self-pay

## 2021-08-08 DIAGNOSIS — M810 Age-related osteoporosis without current pathological fracture: Secondary | ICD-10-CM | POA: Diagnosis not present

## 2021-08-08 DIAGNOSIS — G5603 Carpal tunnel syndrome, bilateral upper limbs: Secondary | ICD-10-CM

## 2021-08-08 NOTE — Assessment & Plan Note (Addendum)
Bone density very low, we are switching from Fosamax to Prolia. We will work on approval.  Labs are adequate for starting Prolia.  We will check a calcium, magnesium, phosphorus within 2 weeks of starting.

## 2021-08-08 NOTE — Addendum Note (Signed)
Addended by: Gust Brooms on: 08/08/2021 10:16 AM   Modules accepted: Orders

## 2021-08-08 NOTE — Progress Notes (Addendum)
° ° °  Procedures performed today:    Procedure: Real-time Ultrasound Guided hydrodissection of the right median nerve at the carpal tunnel Device: Samsung HS60 Verbal informed consent obtained.  Time-out conducted.  Noted no overlying erythema, induration, or other signs of local infection.  Skin prepped in a sterile fashion.  Local anesthesia: Topical Ethyl chloride.  With sterile technique and under real time ultrasound guidance: Noted enlarged median nerve.  Using a 25-gauge needle advanced into the carpal tunnel, taking care to avoid intraneural injection I injected medication both superficial to and deep to the median nerve freeing it from surrounding structures, I then redirected the needle deep and injected further medication around the flexor tendons deep within the carpal tunnel for a total of 1 cc kenalog 40, 5 cc 1% lidocaine without epinephrine. Completed without difficulty  Advised to call if fevers/chills, erythema, induration, drainage, or persistent bleeding.  Images permanently stored and available for review in PACS.  Impression: Technically successful ultrasound guided median nerve hydrodissection.  Independent interpretation of notes and tests performed by another provider:   None.  Brief History, Exam, Impression, and Recommendations:    Carpal tunnel syndrome Repeat median nerve Hydro dissection right-sided right side last done in June 2022, left side last done in April. Return as needed.  Osteoporosis Bone density very low, we are switching from Fosamax to Prolia. We will work on approval.  Labs are adequate for starting Prolia.  We will check a calcium, magnesium, phosphorus within 2 weeks of starting.  Chronic process with exacerbation and pharmacologic intervention  ___________________________________________ [name]. Dianah Field, M.D., ABFM., CAQSM. Primary Care and [place] Instructor of [place] [facility] of [facility]

## 2021-08-08 NOTE — Assessment & Plan Note (Signed)
Repeat median nerve Hydro dissection right-sided right side last done in June 2022, left side last done in April. Return as needed.

## 2021-08-08 NOTE — Telephone Encounter (Signed)
Started a PA for Prolia.  Ref # [id number] Auth # [id number]  Until July 30 2022

## 2021-08-09 LAB — BASIC METABOLIC PANEL WITH GFR
BUN/Creatinine Ratio: 16 (calc) (ref 6–22)
BUN: 19 mg/dL (ref 7–25)
CO2: 20 mmol/L (ref 20–32)
Calcium: 9.7 mg/dL (ref 8.6–10.4)
Chloride: 112 mmol/L — ABNORMAL HIGH (ref 98–110)
Creat: 1.19 mg/dL — ABNORMAL HIGH (ref 0.60–1.00)
Glucose, Bld: 95 mg/dL (ref 65–99)
Potassium: 5.4 mmol/L — ABNORMAL HIGH (ref 3.5–5.3)
Sodium: 142 mmol/L (ref 135–146)
eGFR: 49 mL/min/{1.73_m2} — ABNORMAL LOW (ref >=60)

## 2021-08-09 NOTE — Addendum Note (Signed)
Addended by: Silverio Decamp on: 08/09/2021 12:06 PM   Modules accepted: Orders

## 2021-08-15 ENCOUNTER — Telehealth: Payer: Self-pay

## 2021-08-15 NOTE — Telephone Encounter (Signed)
Patient is coming Friday for her first Prolia injection. She wants to know if she should still take her Fosamax?

## 2021-08-15 NOTE — Telephone Encounter (Signed)
Stop Fosamax now.

## 2021-08-15 NOTE — Telephone Encounter (Signed)
Patient aware to stop Fosamax and verbalized understanding.

## 2021-08-19 ENCOUNTER — Ambulatory Visit (INDEPENDENT_AMBULATORY_CARE_PROVIDER_SITE_OTHER): Payer: Medicare Other | Admitting: Sports Medicine

## 2021-08-19 ENCOUNTER — Other Ambulatory Visit: Payer: Self-pay

## 2021-08-19 ENCOUNTER — Ambulatory Visit: Payer: Medicare Other | Admitting: Sports Medicine

## 2021-08-19 VITALS — BP 117/64 | HR 61 | Ht <= 58 in | Wt 153.0 lb

## 2021-08-19 DIAGNOSIS — M810 Age-related osteoporosis without current pathological fracture: Secondary | ICD-10-CM | POA: Diagnosis not present

## 2021-08-19 MED ORDER — DENOSUMAB 60 MG/ML ~~LOC~~ SOSY
60.0000 mg | PREFILLED_SYRINGE | Freq: Once | SUBCUTANEOUS | Status: AC
  Administered 2021-08-19: 60 mg via SUBCUTANEOUS

## 2021-08-19 NOTE — Progress Notes (Signed)
Pt is here for a Prolia injection. Pt reports taking calcium and vitamin D daily. Pt's calcium levels and kidney function was within normal limits,.  Pt tolerated injection well without complications. Pt advised to schedule next injection in 6 months.   Prior approval until 06/2022.

## 2021-09-08 ENCOUNTER — Other Ambulatory Visit: Payer: Self-pay | Admitting: Sports Medicine

## 2021-09-08 DIAGNOSIS — I10 Essential (primary) hypertension: Secondary | ICD-10-CM

## 2021-10-26 DIAGNOSIS — C50412 Malignant neoplasm of upper-outer quadrant of left female breast: Secondary | ICD-10-CM | POA: Diagnosis not present

## 2021-10-26 DIAGNOSIS — Z170 Estrogen receptor positive status [ER+]: Secondary | ICD-10-CM | POA: Diagnosis not present

## 2021-10-26 DIAGNOSIS — M810 Age-related osteoporosis without current pathological fracture: Secondary | ICD-10-CM | POA: Diagnosis not present

## 2021-11-12 ENCOUNTER — Other Ambulatory Visit: Payer: Self-pay | Admitting: Sports Medicine

## 2021-11-12 DIAGNOSIS — M1812 Unilateral primary osteoarthritis of first carpometacarpal joint, left hand: Secondary | ICD-10-CM

## 2021-11-17 DIAGNOSIS — Z1231 Encounter for screening mammogram for malignant neoplasm of breast: Secondary | ICD-10-CM | POA: Diagnosis not present

## 2021-11-21 NOTE — Progress Notes (Signed)
? ?GYNECOLOGY OFFICE VISIT NOTE ? ?History:  ? Tracey Marshall is a 71 y.o. G4P0 here today for vulvar itching. She has had the itching for the last 2 months. Sometimes the skin is so itchy she cannot help but scratch. She has no discharge but has had skin ulcerating and bleeding. The rash is external. She has never had anything like this. She has tried some OTC anti-itch relief medications which have helped but not resolved the issue.   ? ?She denies any abnormal vaginal discharge, bleeding, pelvic pain or other concerns. ? ? ?  ?Past Medical History:  ?Diagnosis Date  ? Allergy   ? Arthritis   ? CHF (congestive heart failure) ([place])   ? Emphysema of lung ([place])   ? Hyperlipidemia   ? Hypertension   ? ? ?Past Surgical History:  ?Procedure Laterality Date  ? BREAST SURGERY    ? CESAREAN SECTION    ? CORONARY ARTERY BYPASS GRAFT    ? ? ?The following portions of the patient's history were reviewed and updated as appropriate: allergies, current medications, past family history, past medical history, past social history, past surgical history and problem list.  ? ?Health Maintenance:   ?No recent pap on file in computer or in care everywhere.  ?Last mxr 09/2019 ? ?Review of Systems:  ?Pertinent items noted in HPI and remainder of comprehensive ROS otherwise negative. ? ?Physical Exam:  ?BP (!) 114/59   Pulse 60   Ht '4\' 9"'$  (1.448 m)   Wt 154 lb (69.9 kg)   BMI 33.33 kg/m?  ?CONSTITUTIONAL: Well-developed, well-nourished female in no acute distress.  ?HEENT:  Normocephalic, atraumatic. External right and left ear normal. No scleral icterus.  ?NECK: Normal range of motion, supple, no masses noted on observation ?SKIN: No rash noted. Not diaphoretic. No erythema. No pallor. ?MUSCULOSKELETAL: Normal range of motion. No edema noted. ?NEUROLOGIC: Alert and oriented to person, place, and time. Normal muscle tone coordination. No cranial nerve deficit noted. ?PSYCHIATRIC: Normal mood and affect. Normal behavior. Normal judgment  and thought content. ? ?CARDIOVASCULAR: Normal heart rate noted ?RESPIRATORY: Effort and breath sounds normal, no problems with respiration noted ?ABDOMEN: No masses noted. No other overt distention noted.   ? ?PELVIC: EFG diffusely erythematous and swollen with some areas of ulceration of the vulva on the right inner thigh with the rash extending to the groin, it involves both labia majora and the skin externally from there, no internal labial involvement; normal urethral meatus; No abnormal discharge noted. Performed in the presence of a chaperone ? ?Labs and Imaging ?No results found for this or any previous visit (from the past 168 hour(s)). ?No results found.  ?Assessment and Plan:  ?Tracey Marshall was seen today for vaginal itching. ? ?Diagnoses and all orders for this visit: ? ?Vulvar itching ?- Appears c/w yeast - did internal and external cultures. Will start mycolog and diflucan for presumptive yeast but if not completely resolved by 8w follow up, I will recommend skin biopsy. Discussed this with pt today and she voices understanding.  ?-     Cervicovaginal ancillary only( [place]) ?-     Fungus Culture & Smear ?-     nystatin-triamcinolone ointment (MYCOLOG); Apply 1 application. topically 2 (two) times daily. BID for 2 weeks, nightly for 2 weeks, then twice a week for 4 weeks ?-     fluconazole (DIFLUCAN) 150 MG tablet; Take 1 tablet (150 mg total) by mouth every 3 (three) days. For three doses ? ? ? ?  Routine preventative health maintenance measures emphasized. ?Please refer to After Visit Summary for other counseling recommendations.  ? ?Return in about 8 weeks (around 01/19/2022) for Follow up for vulvar itchiing. ? ?Radene Gunning, MD, FACOG ?Obstetrician Social research officer, government, Faculty Practice ?[facility] [place] ? ? ? ? ? ?

## 2021-11-24 ENCOUNTER — Encounter: Payer: Self-pay | Admitting: Obstetrics and Gynecology

## 2021-11-24 ENCOUNTER — Ambulatory Visit: Payer: Medicare Other | Admitting: Obstetrics and Gynecology

## 2021-11-24 ENCOUNTER — Other Ambulatory Visit (HOSPITAL_COMMUNITY)
Admission: RE | Admit: 2021-11-24 | Discharge: 2021-11-24 | Disposition: A | Discharge status: Home / Self Care | Payer: Medicare Other | Source: Ambulatory Visit | Attending: Obstetrics and Gynecology | Admitting: Obstetrics and Gynecology

## 2021-11-24 VITALS — BP 114/59 | HR 60 | Ht <= 58 in | Wt 154.0 lb

## 2021-11-24 DIAGNOSIS — L292 Pruritus vulvae: Secondary | ICD-10-CM | POA: Insufficient documentation

## 2021-11-24 MED ORDER — FLUCONAZOLE 150 MG PO TABS: 150.0000 mg | ORAL_TABLET | ORAL | 3 refills | Status: DC

## 2021-11-24 MED ORDER — NYSTATIN-TRIAMCINOLONE 100000-0.1 UNIT/GM-% EX OINT: 1.0000 "application " | TOPICAL_OINTMENT | Freq: Two times a day (BID) | CUTANEOUS | 0 refills | Status: DC

## 2021-11-25 LAB — CERVICOVAGINAL ANCILLARY ONLY
Bacterial Vaginitis (gardnerella): NEGATIVE
Candida Glabrata: NEGATIVE
Candida Vaginitis: NEGATIVE
Comment: NEGATIVE
Comment: NEGATIVE
Comment: NEGATIVE

## 2021-12-12 ENCOUNTER — Ambulatory Visit (INDEPENDENT_AMBULATORY_CARE_PROVIDER_SITE_OTHER): Payer: Medicare Other | Admitting: Sports Medicine

## 2021-12-12 ENCOUNTER — Ambulatory Visit (INDEPENDENT_AMBULATORY_CARE_PROVIDER_SITE_OTHER): Payer: Medicare Other

## 2021-12-12 DIAGNOSIS — G5603 Carpal tunnel syndrome, bilateral upper limbs: Secondary | ICD-10-CM | POA: Diagnosis not present

## 2021-12-12 DIAGNOSIS — Z0000 Encounter for general adult medical examination without abnormal findings: Secondary | ICD-10-CM

## 2021-12-12 DIAGNOSIS — E669 Obesity, unspecified: Secondary | ICD-10-CM

## 2021-12-12 DIAGNOSIS — E1169 Type 2 diabetes mellitus with other specified complication: Secondary | ICD-10-CM

## 2021-12-12 DIAGNOSIS — N289 Disorder of kidney and ureter, unspecified: Secondary | ICD-10-CM | POA: Diagnosis not present

## 2021-12-12 NOTE — Assessment & Plan Note (Signed)
Labs ordered, she will come back tomorrow fasting for blood work and then come to see me for an annual physical after that. ?

## 2021-12-12 NOTE — Assessment & Plan Note (Addendum)
Recurrence of carpal tunnel symptoms, last right-sided median nerve hydrodissection on the right was in January 2023, last median nerve hydrodissection on the left was in April 2022. Repeat left side today. (Of note PACS procedure note inadvertently reported as right side)

## 2021-12-12 NOTE — Progress Notes (Addendum)
    Procedures performed today:    Procedure: Real-time Ultrasound Guided hydrodissection of the left median nerve at the carpal tunnel Device: Samsung HS60 Verbal informed consent obtained.  Time-out conducted.  Noted no overlying erythema, induration, or other signs of local infection.  Skin prepped in a sterile fashion.  Local anesthesia: Topical Ethyl chloride.  With sterile technique and under real time ultrasound guidance: Noted enlarged median nerve.  Using a 25-gauge needle advanced into the carpal tunnel, taking care to avoid intraneural injection I injected medication both superficial to and deep to the median nerve freeing it from surrounding structures, I then redirected the needle deep and injected further medication around the flexor tendons deep within the carpal tunnel for a total of 1 cc kenalog 40, 5 cc 1% lidocaine without epinephrine. Completed without difficulty  Advised to call if fevers/chills, erythema, induration, drainage, or persistent bleeding.  Images permanently stored and available for review in PACS.  Impression: Technically successful ultrasound guided median nerve hydrodissection.  Independent interpretation of notes and tests performed by another provider:   None.  Brief History, Exam, Impression, and Recommendations:    Carpal tunnel syndrome Recurrence of carpal tunnel symptoms, last right-sided median nerve hydrodissection on the right was in January 2023, last median nerve hydrodissection on the left was in April 2022. Repeat left side today. (Of note PACS procedure note inadvertently reported as right side)  Annual physical exam Labs ordered, she will come back tomorrow fasting for blood work and then come to see me for an annual physical after that.  CKD (chronic kidney disease) stage 3, GFR 30-59 ml/min (HCC) Labs look okay, slight renal dysfunction, go ahead and hold off on the meloxicam for now.  Hydrate aggressively.  We can recheck this in  a month.  Orders placed.  Chronic process with exacerbation and pharmacologic intervention  ___________________________________________ [name]. Dianah Field, M.D., ABFM., CAQSM. Primary Care and [place] Instructor of [place] of [facility] of Medicine

## 2021-12-13 DIAGNOSIS — E1169 Type 2 diabetes mellitus with other specified complication: Secondary | ICD-10-CM | POA: Diagnosis not present

## 2021-12-14 DIAGNOSIS — N289 Disorder of kidney and ureter, unspecified: Secondary | ICD-10-CM | POA: Insufficient documentation

## 2021-12-14 DIAGNOSIS — N183 Chronic kidney disease, stage 3 unspecified: Secondary | ICD-10-CM | POA: Insufficient documentation

## 2021-12-14 HISTORY — DX: Chronic kidney disease, stage 3 unspecified: N18.30

## 2021-12-14 LAB — CBC
HCT: 42.4 % (ref 35.0–45.0)
Hemoglobin: 13.8 g/dL (ref 11.7–15.5)
MCH: 30.3 pg (ref 27.0–33.0)
MCHC: 32.5 g/dL (ref 32.0–36.0)
MCV: 93.2 fL (ref 80.0–100.0)
MPV: 9.8 fL (ref 7.5–12.5)
Platelets: 252 10*3/uL (ref 140–400)
RBC: 4.55 10*6/uL (ref 3.80–5.10)
RDW: 12.4 % (ref 11.0–15.0)
WBC: 10.2 10*3/uL (ref 3.8–10.8)

## 2021-12-14 LAB — HEMOGLOBIN A1C
Hgb A1c MFr Bld: 6.2 % of total Hgb — ABNORMAL HIGH (ref <5.7)
Mean Plasma Glucose: 131 mg/dL
eAG (mmol/L): 7.3 mmol/L

## 2021-12-14 LAB — COMPREHENSIVE METABOLIC PANEL
AG Ratio: 1.6 (calc) (ref 1.0–2.5)
ALT: 25 U/L (ref 6–29)
AST: 20 U/L (ref 10–35)
Albumin: 4.4 g/dL (ref 3.6–5.1)
Alkaline phosphatase (APISO): 53 U/L (ref 37–153)
BUN/Creatinine Ratio: 19 (calc) (ref 6–22)
BUN: 26 mg/dL — ABNORMAL HIGH (ref 7–25)
CO2: 22 mmol/L (ref 20–32)
Calcium: 10.1 mg/dL (ref 8.6–10.4)
Chloride: 112 mmol/L — ABNORMAL HIGH (ref 98–110)
Creat: 1.36 mg/dL — ABNORMAL HIGH (ref 0.60–1.00)
Globulin: 2.7 g/dL (calc) (ref 1.9–3.7)
Glucose, Bld: 113 mg/dL — ABNORMAL HIGH (ref 65–99)
Potassium: 5.3 mmol/L (ref 3.5–5.3)
Sodium: 141 mmol/L (ref 135–146)
Total Bilirubin: 0.3 mg/dL (ref 0.2–1.2)
Total Protein: 7.1 g/dL (ref 6.1–8.1)

## 2021-12-14 LAB — TSH: TSH: 2.86 mIU/L (ref 0.40–4.50)

## 2021-12-14 LAB — EXTRA SPECIMEN

## 2021-12-14 LAB — LIPID PANEL
Cholesterol: 102 mg/dL (ref <200)
HDL: 49 mg/dL — ABNORMAL LOW (ref >=50)
LDL Cholesterol (Calc): 37 mg/dL (calc)
Non-HDL Cholesterol (Calc): 53 mg/dL (calc) (ref <130)
Total CHOL/HDL Ratio: 2.1 (calc) (ref <5.0)
Triglycerides: 81 mg/dL (ref <150)

## 2021-12-14 NOTE — Assessment & Plan Note (Signed)
Labs look okay, slight renal dysfunction, go ahead and hold off on the meloxicam for now.  Hydrate aggressively.  We can recheck this in a month.  Orders placed. ?

## 2021-12-14 NOTE — Addendum Note (Signed)
Addended by: Silverio Decamp on: 12/14/2021 08:53 AM ? ? Modules accepted: Orders ? ?

## 2021-12-27 LAB — FUNGUS CULTURE W SMEAR
MICRO NUMBER:: 13320886
SPECIMEN QUALITY:: ADEQUATE

## 2022-01-18 ENCOUNTER — Ambulatory Visit (INDEPENDENT_AMBULATORY_CARE_PROVIDER_SITE_OTHER): Payer: Medicare Other | Admitting: Sports Medicine

## 2022-01-18 ENCOUNTER — Encounter: Payer: Self-pay | Admitting: Sports Medicine

## 2022-01-18 VITALS — BP 102/45 | HR 69 | Resp 18 | Ht <= 58 in | Wt 153.0 lb

## 2022-01-18 DIAGNOSIS — E669 Obesity, unspecified: Secondary | ICD-10-CM

## 2022-01-18 DIAGNOSIS — Z0000 Encounter for general adult medical examination without abnormal findings: Secondary | ICD-10-CM | POA: Diagnosis not present

## 2022-01-18 DIAGNOSIS — E1169 Type 2 diabetes mellitus with other specified complication: Secondary | ICD-10-CM

## 2022-01-18 DIAGNOSIS — N1832 Chronic kidney disease, stage 3b: Secondary | ICD-10-CM

## 2022-01-18 NOTE — Patient Instructions (Signed)
Find out which GLP-1 injection is preferred on your insurance plan and I will call it in.

## 2022-01-18 NOTE — Progress Notes (Addendum)
Subjective:    CC: Annual Physical Exam  HPI:  This patient is here for their annual physical  I reviewed the past medical history, family history, social history, surgical history, and allergies today and no changes were needed.  Please see the problem list section below in epic for further details.  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  Vaping Use   Vaping Use: Never used  Substance and Sexual Activity   Alcohol use: No   Drug use: No   Sexual activity: Never  Other Topics Concern   Not on file  Social History Narrative   Lives with her husband. She still works part-time as a [employer]ar[employer] manager. She has four children. She enjoys spending time with her friends.   Social Determinants of Health   Financial Resource Strain: Low Risk  (07/22/2021)   Overall Financial Resource Strain (CARDIA)    Difficulty of Paying Living Expenses: Not hard at all  Food Insecurity: No Food Insecurity (07/22/2021)   Hunger Vital Sign    Worried About Running Out of Food in the Last Year: Never true    Ran Out of Food in the Last Year: Never true   Transportation Needs: No Transportation Needs (07/22/2021)   PRAPARE - Hydrologist (Medical): No    Lack of Transportation (Non-Medical): No  Physical Activity: Inactive (07/22/2021)   Exercise Vital Sign    Days of Exercise per Week: 0 days    Minutes of Exercise per Session: 0 min  Stress: No Stress Concern Present (07/22/2021)   [place]    Feeling of Stress : Not at all  Social Connections: Moderately Integrated (07/22/2021)   Social Connection and Isolation Panel [NHANES]    Frequency of Communication with Friends and Family: More than three times a week    Frequency of Social Gatherings with Friends and Family: More than three times a week    Attends Religious Services: Never    Marine scientist or Organizations: Yes    Attends Music therapist: More than 4 times per year    Marital Status: Married   Family History: Family History  Problem Relation Age of Onset   Diabetes Sister    Healthy Mother    Healthy Father    Cancer Neg Hx    Hypertension Neg Hx    Allergies: Allergies  Allergen  Reactions   Nickel Rash   Influenza Vaccines    Medications: See med rec.  Review of Systems: No headache, visual changes, nausea, vomiting, diarrhea, constipation, dizziness, abdominal pain, skin rash, fevers, chills, night sweats, swollen lymph nodes, weight loss, chest pain, body aches, joint swelling, muscle aches, shortness of breath, mood changes, visual or auditory hallucinations.  Objective:    General: Well Developed, well nourished, and in no acute distress.  Neuro: Alert and oriented x3, extra-ocular muscles intact, sensation grossly intact. Cranial nerves II through XII are intact, motor, sensory, and coordinative functions are all intact. HEENT: Normocephalic, atraumatic, pupils equal round reactive to light, neck supple, no masses, no lymphadenopathy,  thyroid nonpalpable. Oropharynx, nasopharynx, external ear canals are unremarkable. Skin: Warm and dry, no rashes noted.  Cardiac: Regular rate and rhythm, no murmurs rubs or gallops.  Respiratory: Clear to auscultation bilaterally. Not using accessory muscles, speaking in full sentences.  Abdominal: Soft, nontender, nondistended, positive bowel sounds, no masses, no organomegaly.  Musculoskeletal: Shoulder, elbow, wrist, hip, knee, ankle stable, and with full range of motion.  Impression and Recommendations:    The patient was counselled, risk factors were discussed, anticipatory guidance given.  Annual physical exam Annual physical as above. Up-to-date on preventive measures.  Diabetes mellitus type 2 in obese [facility]) Laylee does have diabetes mellitus type 2, overall well controlled, she does need to lose ventricular weight, we had a discussion about her diet, exercise, and I would like her to find out what her insurance plans preferred GLP-1's and I am happy to give this a try to help her lose weight. I will also print out some information on carbohydrate choices.  CKD (chronic kidney disease) stage 3, GFR 30-59 ml/min (HCC) Chronic stable CKD 3, continue current medications.   ___________________________________________ [name]. Dianah Field, M.D., ABFM., CAQSM. Primary Care and Sports Medicine [place] [facility]  Adjunct Professor of [place] of [facility] of Medicine

## 2022-01-18 NOTE — Assessment & Plan Note (Signed)
Tracey Marshall does have diabetes mellitus type 2, overall well controlled, she does need to lose ventricular weight, we had a discussion about her diet, exercise, and I would like her to find out what her insurance plans preferred GLP-1's and I am happy to give this a try to help her lose weight. I will also print out some information on carbohydrate choices.

## 2022-01-18 NOTE — Assessment & Plan Note (Signed)
Annual physical as above. Up-to-date on preventive measures.

## 2022-01-19 ENCOUNTER — Encounter: Payer: Self-pay | Admitting: Sports Medicine

## 2022-01-19 DIAGNOSIS — E1169 Type 2 diabetes mellitus with other specified complication: Secondary | ICD-10-CM

## 2022-01-19 DIAGNOSIS — E669 Obesity, unspecified: Secondary | ICD-10-CM

## 2022-01-20 MED ORDER — OZEMPIC (0.25 OR 0.5 MG/DOSE) 2 MG/1.5ML ~~LOC~~ SOPN: PEN_INJECTOR | SUBCUTANEOUS | 1 refills | Status: DC

## 2022-01-22 ENCOUNTER — Other Ambulatory Visit: Payer: Self-pay | Admitting: Sports Medicine

## 2022-01-26 ENCOUNTER — Ambulatory Visit: Payer: Medicare Other | Admitting: Obstetrics and Gynecology

## 2022-02-17 ENCOUNTER — Ambulatory Visit: Payer: Medicare Other

## 2022-02-24 ENCOUNTER — Ambulatory Visit: Payer: Medicare Other

## 2022-02-24 ENCOUNTER — Other Ambulatory Visit: Payer: Self-pay

## 2022-02-24 DIAGNOSIS — M810 Age-related osteoporosis without current pathological fracture: Secondary | ICD-10-CM

## 2022-02-25 LAB — COMPLETE METABOLIC PANEL WITH GFR
AG Ratio: 1.7 (calc) (ref 1.0–2.5)
ALT: 23 U/L (ref 6–29)
AST: 21 U/L (ref 10–35)
Albumin: 4.2 g/dL (ref 3.6–5.1)
Alkaline phosphatase (APISO): 47 U/L (ref 37–153)
BUN/Creatinine Ratio: 11 (calc) (ref 6–22)
BUN: 15 mg/dL (ref 7–25)
CO2: 20 mmol/L (ref 20–32)
Calcium: 9.3 mg/dL (ref 8.6–10.4)
Chloride: 111 mmol/L — ABNORMAL HIGH (ref 98–110)
Creat: 1.36 mg/dL — ABNORMAL HIGH (ref 0.60–1.00)
Globulin: 2.5 g/dL (calc) (ref 1.9–3.7)
Glucose, Bld: 84 mg/dL (ref 65–99)
Potassium: 4.5 mmol/L (ref 3.5–5.3)
Sodium: 141 mmol/L (ref 135–146)
Total Bilirubin: 0.4 mg/dL (ref 0.2–1.2)
Total Protein: 6.7 g/dL (ref 6.1–8.1)
eGFR: 41 mL/min/{1.73_m2} — ABNORMAL LOW (ref >=60)

## 2022-02-25 NOTE — Assessment & Plan Note (Signed)
Chronic stable CKD 3, continue current medications.

## 2022-02-27 ENCOUNTER — Ambulatory Visit (INDEPENDENT_AMBULATORY_CARE_PROVIDER_SITE_OTHER): Payer: Medicare Other

## 2022-02-27 ENCOUNTER — Ambulatory Visit (INDEPENDENT_AMBULATORY_CARE_PROVIDER_SITE_OTHER): Payer: Medicare Other | Admitting: Sports Medicine

## 2022-02-27 DIAGNOSIS — G5601 Carpal tunnel syndrome, right upper limb: Secondary | ICD-10-CM

## 2022-02-27 DIAGNOSIS — M810 Age-related osteoporosis without current pathological fracture: Secondary | ICD-10-CM | POA: Diagnosis not present

## 2022-02-27 MED ORDER — DENOSUMAB 60 MG/ML ~~LOC~~ SOSY
60.0000 mg | PREFILLED_SYRINGE | Freq: Once | SUBCUTANEOUS | Status: AC
  Administered 2022-02-27: 60 mg via SUBCUTANEOUS

## 2022-02-27 NOTE — Progress Notes (Signed)
    Procedures performed today:    Procedure: Real-time Ultrasound Guided hydrodissection of the right median nerve at the carpal tunnel Device: Samsung HS60 Verbal informed consent obtained.  Time-out conducted.  Noted no overlying erythema, induration, or other signs of local infection.  Skin prepped in a sterile fashion.  Local anesthesia: Topical Ethyl chloride.  With sterile technique and under real time ultrasound guidance: Noted enlarged nerve, using a 25-gauge needle advanced into the carpal tunnel, taking care to avoid intraneural injection I injected medication both superficial to and deep to the median nerve freeing it from surrounding structures, I then redirected the needle deep and injected further medication around the flexor tendons deep within the carpal tunnel for a total of 1 cc kenalog 40, 5 cc 1% lidocaine without epinephrine. Completed without difficulty  Advised to call if fevers/chills, erythema, induration, drainage, or persistent bleeding.  Images permanently stored and available for review in PACS.  Impression: Technically successful ultrasound guided median nerve hydrodissection.  Independent interpretation of notes and tests performed by another provider:   None.  Brief History, Exam, Impression, and Recommendations:    Carpal tunnel syndrome Recurrence of carpal tunnel symptoms on the right, last right-sided median nerve hydrodissection was January 2023, last left-sided median nerve hydrodissection was May 2023.  Chronic process with exacerbation and pharmacologic intervention  ____________________________________________ [name]. Dianah Field, M.D., ABFM., CAQSM., AME. Primary Care and Sports Medicine [place] [facility]  Adjunct Professor of [place] of [facility] of Medicine  Risk manager

## 2022-02-27 NOTE — Addendum Note (Signed)
Addended by: Dema Severin on: 02/27/2022 09:31 AM   Modules accepted: Orders

## 2022-02-27 NOTE — Assessment & Plan Note (Signed)
Recurrence of carpal tunnel symptoms on the right, last right-sided median nerve hydrodissection was January 2023, last left-sided median nerve hydrodissection was May 2023.

## 2022-02-28 ENCOUNTER — Ambulatory Visit: Payer: Medicare Other

## 2022-03-05 ENCOUNTER — Other Ambulatory Visit: Payer: Self-pay | Admitting: Sports Medicine

## 2022-03-05 DIAGNOSIS — E669 Obesity, unspecified: Secondary | ICD-10-CM

## 2022-04-11 ENCOUNTER — Ambulatory Visit (INDEPENDENT_AMBULATORY_CARE_PROVIDER_SITE_OTHER): Payer: Medicare Other | Admitting: Sports Medicine

## 2022-04-11 VITALS — BP 106/65 | HR 83 | Ht <= 58 in | Wt 146.0 lb

## 2022-04-11 DIAGNOSIS — E1169 Type 2 diabetes mellitus with other specified complication: Secondary | ICD-10-CM | POA: Diagnosis not present

## 2022-04-11 DIAGNOSIS — E538 Deficiency of other specified B group vitamins: Secondary | ICD-10-CM

## 2022-04-11 DIAGNOSIS — R4189 Other symptoms and signs involving cognitive functions and awareness: Secondary | ICD-10-CM

## 2022-04-11 DIAGNOSIS — E669 Obesity, unspecified: Secondary | ICD-10-CM | POA: Diagnosis not present

## 2022-04-11 MED ORDER — SERTRALINE HCL 50 MG PO TABS: ORAL_TABLET | ORAL | 3 refills | Status: DC

## 2022-04-11 NOTE — Assessment & Plan Note (Signed)
Lilliah returns, she is a pleasant 71 year old female, she does have a history of diabetes, she also wanted to lose some weight, she was doing well on Ozempic and then stopped the medication due to some constipation. We will recheck her A1c in about 2 and half months.

## 2022-04-11 NOTE — Assessment & Plan Note (Signed)
Tracey Marshall also has been complaining of significant word finding difficulties, difficulty sleeping, she just lost her husband. She does understand that she is going through a grieving process. She did score fairly high with her GAD-7 and she was very close to positive on the PHQ-9. We will do a work-up including brain MRI, labs, but I do think her symptoms are related to her mood, adding Zoloft 50 with a 6-week follow-up.

## 2022-04-11 NOTE — Progress Notes (Signed)
    Procedures performed today:    None.  Independent interpretation of notes and tests performed by another provider:   None.  Brief History, Exam, Impression, and Recommendations:    Diabetes mellitus type 2 in obese [facility]) Tracey Marshall returns, she is a pleasant 71 year old female, she does have a history of diabetes, she also wanted to lose some weight, she was doing well on Ozempic and then stopped the medication due to some constipation. We will recheck her A1c in about 2 and half months.  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 She does understand that she is going through a grieving process. She did score fairly high with her GAD-7 and she was very close to positive on the PHQ-9. We will do a work-up including brain MRI, labs, but I do think her symptoms are related to her mood, adding Zoloft 50 with a 6-week follow-up.    ____________________________________________ [name]. Dianah Field, M.D., ABFM., CAQSM., AME. Primary Care and Sports Medicine [place] [facility]  Adjunct Professor of [place] of [facility] of Medicine  Risk manager

## 2022-04-24 ENCOUNTER — Ambulatory Visit (INDEPENDENT_AMBULATORY_CARE_PROVIDER_SITE_OTHER): Payer: Medicare Other

## 2022-04-24 DIAGNOSIS — R42 Dizziness and giddiness: Secondary | ICD-10-CM | POA: Diagnosis not present

## 2022-04-24 DIAGNOSIS — R4189 Other symptoms and signs involving cognitive functions and awareness: Secondary | ICD-10-CM | POA: Diagnosis not present

## 2022-04-24 DIAGNOSIS — R413 Other amnesia: Secondary | ICD-10-CM | POA: Diagnosis not present

## 2022-04-24 DIAGNOSIS — G319 Degenerative disease of nervous system, unspecified: Secondary | ICD-10-CM | POA: Diagnosis not present

## 2022-04-24 MED ORDER — GADOPICLENOL 0.5 MMOL/ML IV SOLN
6.6000 mL | Freq: Once | INTRAVENOUS | Status: AC | PRN
  Administered 2022-04-24: 6.6 mL via INTRAVENOUS

## 2022-05-17 ENCOUNTER — Other Ambulatory Visit: Payer: Self-pay | Admitting: Sports Medicine

## 2022-05-17 DIAGNOSIS — E669 Obesity, unspecified: Secondary | ICD-10-CM

## 2022-05-23 ENCOUNTER — Ambulatory Visit (INDEPENDENT_AMBULATORY_CARE_PROVIDER_SITE_OTHER): Payer: Medicare Other | Admitting: Sports Medicine

## 2022-05-23 ENCOUNTER — Encounter: Payer: Self-pay | Admitting: Sports Medicine

## 2022-05-23 VITALS — BP 101/62 | HR 60 | Wt 143.0 lb

## 2022-05-23 DIAGNOSIS — R4189 Other symptoms and signs involving cognitive functions and awareness: Secondary | ICD-10-CM | POA: Diagnosis not present

## 2022-05-23 DIAGNOSIS — Z23 Encounter for immunization: Secondary | ICD-10-CM

## 2022-05-23 MED ORDER — SERTRALINE HCL 100 MG PO TABS: 100.0000 mg | ORAL_TABLET | Freq: Every day | ORAL | 11 refills | Status: DC

## 2022-05-23 NOTE — Assessment & Plan Note (Signed)
Tracey Marshall returns, she was having significant word finding difficulties, difficulty sleeping, she had just lost her husband. I suspected more of a pseudodementia process with depression and grieving. We started Zoloft 50 and she has improved considerably. Bumping up to 100 mg, we can follow this up in 6 weeks if needed.

## 2022-05-23 NOTE — Progress Notes (Signed)
    Procedures performed today:    None.  Independent interpretation of notes and tests performed by another provider:   None.  Brief History, Exam, Impression, and Recommendations:    Pseudodementia Tracey Marshall returns, she was having significant word finding difficulties, difficulty sleeping, she had just lost her husband. I suspected more of a pseudodementia process with depression and grieving. We started Zoloft 50 and she has improved considerably. Bumping up to 100 mg, we can follow this up in 6 weeks if needed.  Chronic process not at goal with pharmacologic intervention  ____________________________________________ [name]. Dianah Field, M.D., ABFM., CAQSM., AME. Primary Care and Sports Medicine [place] [facility]  Adjunct Professor of [place] of [facility] of Medicine  Risk manager

## 2022-06-14 ENCOUNTER — Other Ambulatory Visit: Payer: Self-pay

## 2022-06-14 DIAGNOSIS — R1013 Epigastric pain: Secondary | ICD-10-CM

## 2022-06-14 DIAGNOSIS — E538 Deficiency of other specified B group vitamins: Secondary | ICD-10-CM | POA: Diagnosis not present

## 2022-06-14 DIAGNOSIS — E1169 Type 2 diabetes mellitus with other specified complication: Secondary | ICD-10-CM | POA: Diagnosis not present

## 2022-06-14 MED ORDER — OMEPRAZOLE 40 MG PO CPDR: 40.0000 mg | DELAYED_RELEASE_CAPSULE | Freq: Two times a day (BID) | ORAL | 0 refills | Status: DC

## 2022-06-14 NOTE — Telephone Encounter (Signed)
Med refill

## 2022-06-15 LAB — CBC
HCT: 40.4 % (ref 35.0–45.0)
Hemoglobin: 13.5 g/dL (ref 11.7–15.5)
MCH: 30.8 pg (ref 27.0–33.0)
MCHC: 33.4 g/dL (ref 32.0–36.0)
MCV: 92 fL (ref 80.0–100.0)
MPV: 9.6 fL (ref 7.5–12.5)
Platelets: 262 10*3/uL (ref 140–400)
RBC: 4.39 10*6/uL (ref 3.80–5.10)
RDW: 12.4 % (ref 11.0–15.0)
WBC: 8.9 10*3/uL (ref 3.8–10.8)

## 2022-06-15 LAB — COMPLETE METABOLIC PANEL WITH GFR
AG Ratio: 1.7 (calc) (ref 1.0–2.5)
ALT: 24 U/L (ref 6–29)
AST: 23 U/L (ref 10–35)
Albumin: 4.2 g/dL (ref 3.6–5.1)
Alkaline phosphatase (APISO): 47 U/L (ref 37–153)
BUN/Creatinine Ratio: 23 (calc) — ABNORMAL HIGH (ref 6–22)
BUN: 30 mg/dL — ABNORMAL HIGH (ref 7–25)
CO2: 25 mmol/L (ref 20–32)
Calcium: 9.5 mg/dL (ref 8.6–10.4)
Chloride: 108 mmol/L (ref 98–110)
Creat: 1.28 mg/dL — ABNORMAL HIGH (ref 0.60–1.00)
Globulin: 2.5 g/dL (calc) (ref 1.9–3.7)
Glucose, Bld: 92 mg/dL (ref 65–99)
Potassium: 5.1 mmol/L (ref 3.5–5.3)
Sodium: 141 mmol/L (ref 135–146)
Total Bilirubin: 0.4 mg/dL (ref 0.2–1.2)
Total Protein: 6.7 g/dL (ref 6.1–8.1)
eGFR: 45 mL/min/{1.73_m2} — ABNORMAL LOW (ref >=60)

## 2022-06-15 LAB — HEMOGLOBIN A1C
Hgb A1c MFr Bld: 6 % of total Hgb — ABNORMAL HIGH (ref <5.7)
Mean Plasma Glucose: 126 mg/dL
eAG (mmol/L): 7 mmol/L

## 2022-06-15 LAB — TSH: TSH: 2.74 mIU/L (ref 0.40–4.50)

## 2022-06-15 LAB — VITAMIN B12: Vitamin B-12: 1628 pg/mL — ABNORMAL HIGH (ref 200–1100)

## 2022-06-29 ENCOUNTER — Other Ambulatory Visit: Payer: Self-pay | Admitting: Sports Medicine

## 2022-06-29 DIAGNOSIS — R1013 Epigastric pain: Secondary | ICD-10-CM

## 2022-07-03 ENCOUNTER — Encounter: Payer: Self-pay | Admitting: Sports Medicine

## 2022-07-06 ENCOUNTER — Encounter: Payer: Self-pay | Admitting: Medical-Surgical

## 2022-07-06 ENCOUNTER — Ambulatory Visit (INDEPENDENT_AMBULATORY_CARE_PROVIDER_SITE_OTHER): Payer: Medicare Other | Admitting: Medical-Surgical

## 2022-07-06 VITALS — BP 96/56 | HR 60 | Resp 20 | Ht <= 58 in | Wt 132.6 lb

## 2022-07-06 DIAGNOSIS — K047 Periapical abscess without sinus: Secondary | ICD-10-CM | POA: Diagnosis not present

## 2022-07-06 DIAGNOSIS — H8111 Benign paroxysmal vertigo, right ear: Secondary | ICD-10-CM | POA: Diagnosis not present

## 2022-07-06 MED ORDER — AMOXICILLIN 500 MG PO CAPS: 500.0000 mg | ORAL_CAPSULE | Freq: Two times a day (BID) | ORAL | 0 refills | Status: AC

## 2022-07-06 MED ORDER — MECLIZINE HCL 25 MG PO TABS: 25.0000 mg | ORAL_TABLET | Freq: Three times a day (TID) | ORAL | 2 refills | Status: DC | PRN

## 2022-07-06 NOTE — Patient Instructions (Signed)
How to Perform the Epley Maneuver The Epley maneuver is an exercise that relieves symptoms of vertigo. Vertigo is the feeling that you or your surroundings are moving when they are not. When you feel vertigo, you may feel like the room is spinning and may have trouble walking. The Epley maneuver is used for a type of vertigo caused by a calcium deposit in a part of the inner ear. The maneuver involves changing head positions to help the deposit move out of the area. You can do this maneuver at home whenever you have symptoms of vertigo. You can repeat it in 24 hours if your vertigo has not gone away. Even though the Epley maneuver may relieve your vertigo for a few weeks, it is possible that your symptoms will return. This maneuver relieves vertigo, but it does not relieve dizziness. What are the risks? If it is done correctly, the Epley maneuver is considered safe. Sometimes it can lead to dizziness or nausea that goes away after a short time. If you develop other symptoms--such as changes in vision, weakness, or numbness--stop doing the maneuver and call your health care provider. Supplies needed: A bed or table. A pillow. How to do the Epley maneuver     Sit on the edge of a bed or table with your back straight and your legs extended or hanging over the edge of the bed or table. Turn your head halfway toward the affected ear or side as told by your health care provider. Lie backward quickly with your head turned until you are lying flat on your back. Your head should dangle (head-hanging position). You may want to position a pillow under your shoulders. Hold this position for at least 30 seconds. If you feel dizzy or have symptoms of vertigo, continue to hold the position until the symptoms stop. Turn your head to the opposite direction until your unaffected ear is facing down. Your head should continue to dangle. Hold this position for at least 30 seconds. If you feel dizzy or have symptoms of  vertigo, continue to hold the position until the symptoms stop. Turn your whole body to the same side as your head so that you are positioned on your side. Your head will now be nearly facedown and no longer needs to dangle. Hold for at least 30 seconds. If you feel dizzy or have symptoms of vertigo, continue to hold the position until the symptoms stop. Sit back up. You can repeat the maneuver in 24 hours if your vertigo does not go away. Follow these instructions at home: For 24 hours after doing the Epley maneuver: Keep your head in an upright position. When lying down to sleep or rest, keep your head raised (elevated) with two or more pillows. Avoid excessive neck movements. Activity Do not drive or use machinery if you feel dizzy. After doing the Epley maneuver, return to your normal activities as told by your health care provider. Ask your health care provider what activities are safe for you. General instructions Drink enough fluid to keep your urine pale yellow. Do not drink alcohol. Take over-the-counter and prescription medicines only as told by your health care provider. Keep all follow-up visits. This is important. Preventing vertigo symptoms Ask your health care provider if there is anything you should do at home to prevent vertigo. He or she may recommend that you: Keep your head elevated with two or more pillows while you sleep. Do not sleep on the side of your affected ear. Get   up slowly from bed. Avoid sudden movements during the day. Avoid extreme head positions or movement, such as looking up or bending over. Contact a health care provider if: Your vertigo gets worse. You have other symptoms, including: Nausea. Vomiting. Headache. Get help right away if you: Have vision changes. Have a headache or neck pain that is severe or getting worse. Cannot stop vomiting. Have new numbness or weakness in any part of your body. These symptoms may represent a serious problem  that is an emergency. Do not wait to see if the symptoms will go away. Get medical help right away. Call your local emergency services (911 in the [place] Do not drive yourself to the hospital. Summary Vertigo is the feeling that you or your surroundings are moving when they are not. The Epley maneuver is an exercise that relieves symptoms of vertigo. If the Epley maneuver is done correctly, it is considered safe. This information is not intended to replace advice given to you by your health care provider. Make sure you discuss any questions you have with your health care provider. Document Revised: 06/16/2020 Document Reviewed: 06/16/2020 Elsevier Patient Education  2023 Elsevier Inc.  

## 2022-07-06 NOTE — Progress Notes (Signed)
Established Patient Office Visit  Subjective   Patient ID: Tracey Marshall, female   DOB: 1950-12-11 Age: 71 y.o. MRN: [id number]   Chief Complaint  Patient presents with   Dizziness   HPI Pleasant 71 year old female presenting today for concerns with dizziness.  Notes that she had similar symptoms a little over a year ago that was diagnosed as BPPV.  Her symptoms currently started on Thursday/Friday of last week.  On waking, she rolled to the left to get out of bed and got very dizzy.  Reports that it felt like both she and the room were spinning.  She has had no nausea with her dizziness but notes that it is much worse when laying down.  A couple days ago, she reports that she felt hot but did not take her temperature.  No subjective fevers, chills, or myalgias since then.  Incidentally notes that last year with her vertigo issues, she had a tooth on the right side that had an infection.  Reports that she had meclizine as well as antibiotics and that helped cleared up.  Last week, she had a tooth issue on the left lower jaw causes significant pain.  The tooth was loose and she was able to pull this at home but did not seek other treatment.  Wonders if this could be related.  Requesting not to be lay down on the exam table because this will trigger her dizziness and she is already hesitant to drive.   Objective:    Vitals:   07/06/22 0811  BP: (!) 96/56  Pulse: 60  Resp: 20  Height: '4\' 9"'$  (1.448 m)  Weight: 132 lb 9.6 oz (60.1 kg)  SpO2: 99%  BMI (Calculated): 28.69    Physical Exam Vitals and nursing note reviewed.  Constitutional:      General: She is not in acute distress.    Appearance: Normal appearance. She is not ill-appearing.  HENT:     Head: Normocephalic and atraumatic.  Cardiovascular:     Rate and Rhythm: Normal rate and regular rhythm.     Pulses: Normal pulses.     Heart sounds: Normal heart sounds.  Pulmonary:     Effort: Pulmonary effort is normal. No respiratory  distress.     Breath sounds: Normal breath sounds. No wheezing, rhonchi or rales.  Skin:    General: Skin is warm and dry.  Neurological:     Mental Status: She is alert and oriented to person, place, and time.  Psychiatric:        Mood and Affect: Mood normal.        Behavior: Behavior normal.        Thought Content: Thought content normal.        Judgment: Judgment normal.   No results found for this or any previous visit (from the past 24 hour(s)).     The ASCVD Risk score (Arnett DK, et al., 2019) failed to calculate for the following reasons:   The valid total cholesterol range is 130 to 320 mg/dL   Assessment & Plan:   1. Benign paroxysmal positional vertigo of right ear Unable to perform Dix-Hallpike or Supine head roll tests due to patient declined to lay on the table. Her symptoms are similar to her last episode. Not associated with quick position changes. Treating with Meclizine TID prn. Epley maneuver information printed and provided with AVS. Discussed referral to vestibular physical therapy but patient declined today and wants to try medication first.  - meclizine (  ANTIVERT) 25 MG tablet; Take 1 tablet (25 mg total) by mouth 3 (three) times daily as needed for dizziness.  Dispense: 30 tablet; Refill: 2  2. Tooth infection Some possible connection to her vertigo symptoms since the timeline seems to coincide. With her pain and subjective fever, treating with Amoxicillin '500mg'$  BID x 10 days.   Return if symptoms worsen or fail to improve.  ___________________________________________ Clearnce Sorrel, DNP, APRN, FNP-BC Primary Care and [place]

## 2022-07-07 ENCOUNTER — Ambulatory Visit: Payer: Medicare Other | Admitting: Sports Medicine

## 2022-07-27 ENCOUNTER — Ambulatory Visit (INDEPENDENT_AMBULATORY_CARE_PROVIDER_SITE_OTHER): Payer: Medicare Other | Admitting: Sports Medicine

## 2022-07-27 DIAGNOSIS — Z0000 Encounter for general adult medical examination without abnormal findings: Secondary | ICD-10-CM

## 2022-07-27 NOTE — Patient Instructions (Addendum)
[place] Maintenance Summary and Written Plan of Care  [name] ,  Thank you for allowing me to perform your Medicare Annual Wellness Visit and for your ongoing commitment to your health.   Health Maintenance & Immunization History Health Maintenance  Topic Date Due   OPHTHALMOLOGY EXAM  07/27/2022 (Originally 05/18/2022)   Diabetic kidney evaluation - Urine ACR  07/28/2022 (Originally 06/21/1969)   FOOT EXAM  07/28/2022 (Originally 05/13/2022)   Zoster Vaccines- Shingrix (1 of 2) 10/26/2022 (Originally 06/20/1970)   HEMOGLOBIN A1C  12/13/2022   Diabetic kidney evaluation - eGFR measurement  06/15/2023   Medicare Annual Wellness (AWV)  07/28/2023   MAMMOGRAM  11/18/2023   DTaP/Tdap/Td (2 - Td or Tdap) 06/09/2025   COLONOSCOPY (Pts 45-88yr Insurance coverage will need to be confirmed)  08/30/2025   Pneumonia Vaccine 71 Years old  Completed   DEXA SCAN  Completed   Hepatitis C Screening  Completed   HPV VACCINES  Aged Out   INFLUENZA VACCINE  Discontinued   COVID-19 Vaccine  Discontinued   Immunization History  Administered Date(s) Administered   COVID-19, mRNA, vaccine(Comirnaty)12 years and older 05/23/2022   PFIZER(Purple Top)SARS-COV-2 Vaccination 04/19/2020, 04/19/2020, 05/11/2020   PPD Test 10/31/2016, 05/15/2017, 08/09/2020   Pneumococcal Conjugate-13 06/10/2015   Pneumococcal Polysaccharide-23 12/12/2016   Tdap 06/10/2015    These are the patient goals that we discussed:  Goals Addressed               This Visit's Progress     Patient Stated (pt-stated)        Patient stated that she would like to continue to maintain her activity and healthy lifestyle.         This is a list of Health Maintenance Items that are overdue or due now: Urine ACR Foot exam Eye exam - due in January ( need records)    Orders/Referrals Placed Today: No orders of the defined types were placed in this encounter.  (Contact our referral  department at [phone] you have not spoken with someone about your referral appointment within the next 5 days)    Follow-up Plan Follow-up with TSilverio Decamp MD as planned Please have eye exam records faxed to our office. Medicare wellness visit in one year.  Patient will access AVS on my chart.      Health Maintenance, Female Adopting a healthy lifestyle and getting preventive care are important in promoting health and wellness. Ask your health care provider about: The right schedule for you to have regular tests and exams. Things you can do on your own to prevent diseases and keep yourself healthy. What should I know about diet, weight, and exercise? Eat a healthy diet  Eat a diet that includes plenty of vegetables, fruits, low-fat dairy products, and lean protein. Do not eat a lot of foods that are high in solid fats, added sugars, or sodium. Maintain a healthy weight Body mass index (BMI) is used to identify weight problems. It estimates body fat based on height and weight. Your health care provider can help determine your BMI and help you achieve or maintain a healthy weight. Get regular exercise Get regular exercise. This is one of the most important things you can do for your health. Most adults should: Exercise for at least 150 minutes each week. The exercise should increase your heart rate and make you sweat (moderate-intensity exercise). Do strengthening exercises at least twice a week. This is in addition to the moderate-intensity  exercise. Spend less time sitting. Even light physical activity can be beneficial. Watch cholesterol and blood lipids Have your blood tested for lipids and cholesterol at 73 years of age, then have this test every 5 years. Have your cholesterol levels checked more often if: Your lipid or cholesterol levels are high. You are older than 73 years of age. You are at high risk for heart disease. What should I know about cancer  screening? Depending on your health history and family history, you may need to have cancer screening at various ages. This may include screening for: Breast cancer. Cervical cancer. Colorectal cancer. Skin cancer. Lung cancer. What should I know about heart disease, diabetes, and high blood pressure? Blood pressure and heart disease High blood pressure causes heart disease and increases the risk of stroke. This is more likely to develop in people who have high blood pressure readings or are overweight. Have your blood pressure checked: Every 3-5 years if you are 68-68 years of age. Every year if you are 93 years old or older. Diabetes Have regular diabetes screenings. This checks your fasting blood sugar level. Have the screening done: Once every three years after age 9 if you are at a normal weight and have a low risk for diabetes. More often and at a younger age if you are overweight or have a high risk for diabetes. What should I know about preventing infection? Hepatitis B If you have a higher risk for hepatitis B, you should be screened for this virus. Talk with your health care provider to find out if you are at risk for hepatitis B infection. Hepatitis C Testing is recommended for: Everyone born from [birth year] through 1967. Anyone with known risk factors for hepatitis C. Sexually transmitted infections (STIs) Get screened for STIs, including gonorrhea and chlamydia, if: You are sexually active and are younger than 73 years of age. You are older than 73 years of age and your health care provider tells you that you are at risk for this type of infection. Your sexual activity has changed since you were last screened, and you are at increased risk for chlamydia or gonorrhea. Ask your health care provider if you are at risk. Ask your health care provider about whether you are at high risk for HIV. Your health care provider may recommend a prescription medicine to help prevent HIV  infection. If you choose to take medicine to prevent HIV, you should first get tested for HIV. You should then be tested every 3 months for as long as you are taking the medicine. Pregnancy If you are about to stop having your period (premenopausal) and you may become pregnant, seek counseling before you get pregnant. Take 400 to 800 micrograms (mcg) of folic acid every day if you become pregnant. Ask for birth control (contraception) if you want to prevent pregnancy. Osteoporosis and menopause Osteoporosis is a disease in which the bones lose minerals and strength with aging. This can result in bone fractures. If you are 9 years old or older, or if you are at risk for osteoporosis and fractures, ask your health care provider if you should: Be screened for bone loss. Take a calcium or vitamin D supplement to lower your risk of fractures. Be given hormone replacement therapy (HRT) to treat symptoms of menopause. Follow these instructions at home: Alcohol use Do not drink alcohol if: Your health care provider tells you not to drink. You are pregnant, may be pregnant, or are planning to become pregnant. If  you drink alcohol: Limit how much you have to: 0-1 drink a day. Know how much alcohol is in your drink. In the [place] one drink equals one 12 oz bottle of beer (355 mL), one 5 oz glass of wine (148 mL), or one 1 oz glass of hard liquor (44 mL). Lifestyle Do not use any products that contain nicotine or tobacco. These products include cigarettes, chewing tobacco, and vaping devices, such as e-cigarettes. If you need help quitting, ask your health care provider. Do not use street drugs. Do not share needles. Ask your health care provider for help if you need support or information about quitting drugs. General instructions Schedule regular health, dental, and eye exams. Stay current with your vaccines. Tell your health care provider if: You often feel depressed. You have ever been abused  or do not feel safe at home. Summary Adopting a healthy lifestyle and getting preventive care are important in promoting health and wellness. Follow your health care provider's instructions about healthy diet, exercising, and getting tested or screened for diseases. Follow your health care provider's instructions on monitoring your cholesterol and blood pressure. This information is not intended to replace advice given to you by your health care provider. Make sure you discuss any questions you have with your health care provider. Document Revised: 12/06/2020 Document Reviewed: 12/06/2020 Elsevier Patient Education  [employer].

## 2022-07-27 NOTE — Progress Notes (Signed)
MEDICARE ANNUAL WELLNESS VISIT  07/27/2022  Telephone Visit Disclaimer This Medicare AWV was conducted by telephone due to national recommendations for restrictions regarding the COVID-19 Pandemic (e.g. social distancing).  I verified, using two identifiers, that I am speaking with Tracey Marshall or their authorized healthcare agent. I discussed the limitations, risks, security, and privacy concerns of performing an evaluation and management service by telephone and the potential availability of an in-person appointment in the future. The patient expressed understanding and agreed to proceed.  Location of Patient: Home Location of Provider (nurse):  Provider home  Subjective:    Tracey Marshall is a 71 y.o. female patient of Thekkekandam, [name], MD who had a Medicare Annual Wellness Visit today via telephone. Tracey Marshall is Retired and lives alone. she has 4 children. she reports that she is socially active and does interact with friends/family regularly. she is minimally physically active and enjoys spending time with walk.  Patient Care Team: Silverio Decamp, MD as PCP - General (Family Medicine)     07/27/2022    8:58 AM 07/22/2021    8:12 AM 06/11/2015    3:12 PM  Advanced Directives  Does Patient Have a Medical Advance Directive? Yes Yes No  Type of Advance Directive Living will Living will;Healthcare Power of Attorney   Does patient want to make changes to medical advance directive? No - Patient declined No - Patient declined   Copy of [place] in Chart?  No - copy requested   Would patient like information on creating a medical advance directive?   No - patient declined information    Hospital Utilization Over the Past 12 Months: # of hospitalizations or ER visits: 0 # of surgeries: 0  Review of Systems    Patient reports that her overall health is unchanged compared to last year.  History obtained from chart review and the patient  Patient  Reported Readings (BP, Pulse, CBG, Weight, etc) none  Pain Assessment Pain : No/denies pain     Current Medications & Allergies (verified) Allergies as of 07/27/2022       Reactions   Nickel Rash   Influenza Vaccines         Medication List        Accurate as of July 27, 2022  9:12 AM. If you have any questions, ask your nurse or doctor.          aspirin EC 81 MG tablet Take 1 tablet (81 mg total) by mouth daily.   Calcium-Phosphorus-Vitamin D [phone] MG-MG-UNIT Chew Chew by mouth.   carvedilol 3.125 MG tablet Commonly known as: COREG TAKE 1 TABLET BY MOUTH  TWICE DAILY WITH MEALS   diclofenac Sodium 1 % Gel Commonly known as: VOLTAREN Apply 2 g topically 4 (four) times daily. To affected joint.   ezetimibe 10 MG tablet Commonly known as: ZETIA Take 10 mg by mouth daily.   fluocinonide 0.05 % external solution Commonly known as: LIDEX Apply 1 application topically 2 (two) times daily. To scalp   fluticasone 50 MCG/ACT nasal spray Commonly known as: FLONASE USE 1 SPRAY IN EACH NOSTRIL 2 TIMES A DAY USE LEFT HAND FOR RIGHT NOSTRIL AND RIGHT HAND FOR LEFT NOSTRIL.   lisinopril 40 MG tablet Commonly known as: ZESTRIL TAKE 1 TABLET BY MOUTH  DAILY   meclizine 25 MG tablet Commonly known as: ANTIVERT Take 1 tablet (25 mg total) by mouth 3 (three) times daily as needed for dizziness.   omeprazole 40  MG capsule Commonly known as: PRILOSEC Take 1 capsule (40 mg total) by mouth 2 (two) times daily. Take in the morning and at dinnertime.   rosuvastatin 40 MG tablet Commonly known as: CRESTOR Take by mouth.   sertraline 100 MG tablet Commonly known as: ZOLOFT Take 1 tablet (100 mg total) by mouth daily.        History (reviewed): Past Medical History:  Diagnosis Date   Allergy    Annual physical exam 06/10/2015   Annual physical exam 06/10/2015   Arthritis    CHF (congestive heart failure) (HCC)    CKD (chronic kidney disease) stage 3,  GFR 30-59 ml/min (HCC) 12/14/2021   Emphysema lung ([place]) 12/12/2019   Emphysema of lung ([place])    Hyperlipidemia    Hypertension    Past Surgical History:  Procedure Laterality Date   BREAST SURGERY     CESAREAN SECTION     CORONARY ARTERY BYPASS GRAFT     Family History  Problem Relation Age of Onset   Diabetes Sister    Healthy Mother    Healthy Father    Cancer Neg Hx    Hypertension Neg Hx    Social History   Socioeconomic History   Marital status: Widowed    Spouse name: Delorise Shiner   Number of children: 4   Years of education: 12   Highest education level: GED or equivalent  Occupational History   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  Vaping Use   Vaping Use: Never used  Substance and Sexual Activity   Alcohol use: No   Drug use: No   Sexual activity: Not Currently    Birth control/protection: Condom, Pill  Other Topics Concern   Not on file  Social History Narrative   Lives alone. Her husband passed away in 2023/02/17. She has four children. She enjoys spending time with her friends.   Social Determinants of Health   Financial Resource Strain: Low Risk  (07/25/2022)   Overall Financial Resource Strain (CARDIA)    Difficulty of Paying Living Expenses: Not hard at all  Food Insecurity: No Food Insecurity (07/25/2022)   Hunger Vital Sign    Worried About Running Out of Food in the Last Year: Never true    Ran Out of Food in the Last Year: Never true  Transportation Needs: No Transportation Needs (07/25/2022)   PRAPARE - Hydrologist (Medical): No    Lack of Transportation (Non-Medical): No  Physical Activity: Insufficiently  Active (07/25/2022)   Exercise Vital Sign    Days of Exercise per Week: 2 days    Minutes of Exercise per Session: 30 min  Stress: No Stress Concern Present (07/25/2022)   [place]    Feeling of Stress : Only a little  Social Connections: Socially Isolated (07/27/2022)   Social Connection and Isolation Panel [NHANES]    Frequency of Communication with Friends and Family: More than three times a week    Frequency of Social Gatherings with Friends and Family: Twice a week  Attends Religious Services: Never    Active Member of Clubs or Organizations: No    Attends Archivist Meetings: Never    Marital Status: Widowed    Activities of Daily Living    07/25/2022   11:13 AM  In your present state of health, do you have any difficulty performing the following activities:  Hearing? 0  Vision? 0  Difficulty concentrating or making decisions? 0  Walking or climbing stairs? 0  Dressing or bathing? 0  Doing errands, shopping? 0  Preparing Food and eating ? N  Using the Toilet? N  In the past six months, have you accidently leaked urine? N  Do you have problems with loss of bowel control? N  Managing your Medications? N  Managing your Finances? N  Housekeeping or managing your Housekeeping? N    Patient Education/ Literacy How often do you need to have someone help you when you read instructions, pamphlets, or other written materials from your doctor or pharmacy?: 1 - Never What is the last grade level you completed in school?: GED  Exercise Current Exercise Habits: The patient does not participate in regular exercise at present, Exercise limited by: None identified  Diet Patient reports consuming 2 meals a day and 1 snack(s) a day Patient reports that her primary diet is: Regular Patient reports that she does have regular access to food.   Depression Screen    07/27/2022    8:59 AM 05/23/2022     9:48 AM 04/11/2022   10:25 AM 07/22/2021    8:13 AM 05/13/2021    1:45 PM 01/23/2018    9:47 AM 12/12/2016    2:42 PM  PHQ 2/9 Scores  PHQ - 2 Score 0 2 2 0 0 4 0  PHQ- 9 Score  _0 0     Fall Risk    07/27/2022    8:59 AM 07/25/2022   11:13 AM 01/18/2022   10:40 AM 07/22/2021    8:13 AM 07/18/2021   12:05 PM  [place] in the past year? 0 0 0 0 0  Number falls in past yr: 0 0 0 0 0  Injury with Fall? 0 0 0 0 0  Risk for fall due to : No Fall Risks  No Fall Risks No Fall Risks   Follow up Falls evaluation completed  Falls prevention discussed;Falls evaluation completed Falls evaluation completed      Objective:  Abbigale Mcelhaney seemed alert and oriented and she participated appropriately during our telephone visit.  Blood Pressure Weight BMI  BP Readings from Last 3 Encounters:  07/06/22 (!) 96/56  05/23/22 101/62  04/11/22 106/65   Wt Readings from Last 3 Encounters:  07/06/22 132 lb 9.6 oz (60.1 kg)  05/23/22 143 lb (64.9 kg)  04/11/22 146 lb (66.2 kg)   BMI Readings from Last 1 Encounters:  07/06/22 28.69 kg/m    *Unable to obtain current vital signs, weight, and BMI due to telephone visit type  Hearing/Vision  Vaughan Basta did not seem to have difficulty with hearing/understanding during the telephone conversation Reports that she has had a formal eye exam by an eye care professional within the past year Reports that she has not had a formal hearing evaluation within the past year *Unable to fully assess hearing and vision during telephone visit type  Cognitive Function:    07/27/2022    9:03 AM 04/11/2022   10:00 AM 07/22/2021    8:20 AM  6CIT Screen  What Year? 0 points 0 points 0 points  What month? 0 points 0 points 0 points  What time? 0 points 0 points 0 points  Count back from 20 0 points 0 points 0 points  Months in reverse 0 points 0 points 0 points  Repeat phrase 0 points 0 points 0 points  Total Score 0 points 0 points 0 points    (Normal:0-7, Significant for Dysfunction: >8)  Normal Cognitive Function Screening: Yes   Immunization & Health Maintenance Record Immunization History  Administered Date(s) Administered   COVID-19, mRNA, vaccine(Comirnaty)12 years and older 05/23/2022   PFIZER(Purple Top)SARS-COV-2 Vaccination 04/19/2020, 04/19/2020, 05/11/2020   PPD Test 10/31/2016, 05/15/2017, 08/09/2020   Pneumococcal Conjugate-13 06/10/2015   Pneumococcal Polysaccharide-23 12/12/2016   Tdap 06/10/2015    Health Maintenance  Topic Date Due   OPHTHALMOLOGY EXAM  07/27/2022 (Originally 05/18/2022)   Diabetic kidney evaluation - Urine ACR  07/28/2022 (Originally 06/21/1969)   FOOT EXAM  07/28/2022 (Originally 05/13/2022)   Zoster Vaccines- Shingrix (1 of 2) 10/26/2022 (Originally 06/20/1970)   HEMOGLOBIN A1C  12/13/2022   Diabetic kidney evaluation - eGFR measurement  06/15/2023   Medicare Annual Wellness (AWV)  07/28/2023   MAMMOGRAM  11/18/2023   DTaP/Tdap/Td (2 - Td or Tdap) 06/09/2025   COLONOSCOPY (Pts 45-52yr Insurance coverage will need to be confirmed)  08/30/2025   Pneumonia Vaccine 71 Years old  Completed   DEXA SCAN  Completed   Hepatitis C Screening  Completed   HPV VACCINES  Aged Out   INFLUENZA VACCINE  Discontinued   COVID-19 Vaccine  Discontinued       Assessment  This is a routine wellness examination for [employer]  Health Maintenance: Due or Overdue There are no preventive care reminders to display for this patient.   LTika Hannisdoes not need a referral for Community Assistance: Care Management:   no Social Work:    no Prescription Assistance:  no Nutrition/Diabetes Education:  no   Plan:  Personalized Goals  Goals Addressed               This Visit's Progress     Patient Stated (pt-stated)        Patient stated that she would like to continue to maintain her activity and healthy lifestyle.       Personalized Health Maintenance & Screening Recommendations   Shingles vaccine  - patient declined Influenza vaccine- patient declined Urine ACR Foot exam Eye exam - due in January ( need records)  Lung Cancer Screening Recommended: no (Low Dose CT Chest recommended if Age 73-80years, 30 pack-year currently smoking OR have quit w/in past 15 years) Hepatitis C Screening recommended: no HIV Screening recommended: no  Advanced Directives: Written information was not prepared per patient's request.  Referrals & Orders No orders of the defined types were placed in this encounter.   Follow-up Plan Follow-up with TSilverio Decamp MD as planned Please have eye exam records faxed to our office. Medicare wellness visit in one year.  Patient will access AVS on my chart.   I have personally reviewed and noted the following in the patient's chart:   Medical and social history Use of alcohol, tobacco or illicit drugs  Current medications and supplements Functional ability and status Nutritional status Physical activity Advanced directives List of other physicians Hospitalizations, surgeries, and ER visits in previous 12 months Vitals Screenings to include cognitive, depression, and falls Referrals and appointments  In addition, I have reviewed and  discussed with Tracey Marshall certain preventive protocols, quality metrics, and best practice recommendations. A written personalized care plan for preventive services as well as general preventive health recommendations is available and can be mailed to the patient at her request.      Tinnie Gens, RN BSN  07/27/2022

## 2022-08-10 ENCOUNTER — Other Ambulatory Visit: Payer: Self-pay | Admitting: Sports Medicine

## 2022-08-10 DIAGNOSIS — I10 Essential (primary) hypertension: Secondary | ICD-10-CM

## 2022-09-22 DIAGNOSIS — E119 Type 2 diabetes mellitus without complications: Secondary | ICD-10-CM | POA: Diagnosis not present

## 2022-09-22 LAB — HM DIABETES EYE EXAM

## 2022-10-11 ENCOUNTER — Other Ambulatory Visit: Payer: Self-pay | Admitting: Sports Medicine

## 2022-10-11 DIAGNOSIS — M1812 Unilateral primary osteoarthritis of first carpometacarpal joint, left hand: Secondary | ICD-10-CM

## 2022-10-31 DIAGNOSIS — Z08 Encounter for follow-up examination after completed treatment for malignant neoplasm: Secondary | ICD-10-CM | POA: Diagnosis not present

## 2022-10-31 DIAGNOSIS — C50412 Malignant neoplasm of upper-outer quadrant of left female breast: Secondary | ICD-10-CM | POA: Diagnosis not present

## 2022-10-31 DIAGNOSIS — Z780 Asymptomatic menopausal state: Secondary | ICD-10-CM | POA: Diagnosis not present

## 2022-10-31 DIAGNOSIS — Z170 Estrogen receptor positive status [ER+]: Secondary | ICD-10-CM | POA: Diagnosis not present

## 2022-10-31 DIAGNOSIS — Z9189 Other specified personal risk factors, not elsewhere classified: Secondary | ICD-10-CM | POA: Diagnosis not present

## 2022-10-31 DIAGNOSIS — M810 Age-related osteoporosis without current pathological fracture: Secondary | ICD-10-CM | POA: Diagnosis not present

## 2022-10-31 DIAGNOSIS — Z7189 Other specified counseling: Secondary | ICD-10-CM | POA: Diagnosis not present

## 2022-10-31 DIAGNOSIS — Z1231 Encounter for screening mammogram for malignant neoplasm of breast: Secondary | ICD-10-CM | POA: Diagnosis not present

## 2022-10-31 DIAGNOSIS — Z853 Personal history of malignant neoplasm of breast: Secondary | ICD-10-CM | POA: Diagnosis not present

## 2022-10-31 DIAGNOSIS — Z9889 Other specified postprocedural states: Secondary | ICD-10-CM | POA: Diagnosis not present

## 2022-11-09 ENCOUNTER — Ambulatory Visit (INDEPENDENT_AMBULATORY_CARE_PROVIDER_SITE_OTHER): Payer: Medicare Other | Admitting: Sports Medicine

## 2022-11-09 ENCOUNTER — Other Ambulatory Visit (INDEPENDENT_AMBULATORY_CARE_PROVIDER_SITE_OTHER): Payer: Medicare Other

## 2022-11-09 DIAGNOSIS — Z0000 Encounter for general adult medical examination without abnormal findings: Secondary | ICD-10-CM | POA: Diagnosis not present

## 2022-11-09 DIAGNOSIS — G5603 Carpal tunnel syndrome, bilateral upper limbs: Secondary | ICD-10-CM

## 2022-11-09 NOTE — Progress Notes (Signed)
    Procedures performed today:    Procedure: Real-time Ultrasound Guided hydrodissection of the right median nerve at the carpal tunnel Device: Samsung HS60 Verbal informed consent obtained.  Time-out conducted.  Noted no overlying erythema, induration, or other signs of local infection.  Skin prepped in a sterile fashion.  Local anesthesia: Topical Ethyl chloride.  With sterile technique and under real time ultrasound guidance: Noted enlarged nerve, using a 25-gauge needle advanced into the carpal tunnel, taking care to avoid intraneural injection I injected medication both superficial to and deep to the median nerve freeing it from surrounding structures, I then redirected the needle deep and injected further medication around the flexor tendons deep within the carpal tunnel for a total of 1 cc kenalog 40, 5 cc 1% lidocaine without epinephrine. Completed without difficulty  Advised to call if fevers/chills, erythema, induration, drainage, or persistent bleeding.  Images permanently stored and available for review in PACS.  Impression: Technically successful ultrasound guided median nerve hydrodissection.   Procedure: Real-time Ultrasound Guided hydrodissection of the left median nerve at the carpal tunnel Device: Samsung HS60 Verbal informed consent obtained.  Time-out conducted.  Noted no overlying erythema, induration, or other signs of local infection.  Skin prepped in a sterile fashion.  Local anesthesia: Topical Ethyl chloride.  With sterile technique and under real time ultrasound guidance: Noted enlarged nerve, using a 25-gauge needle advanced into the carpal tunnel, taking care to avoid intraneural injection I injected medication both superficial to and deep to the median nerve freeing it from surrounding structures, I then redirected the needle deep and injected further medication around the flexor tendons deep within the carpal tunnel for a total of 1 cc kenalog 40, 5 cc 1%  lidocaine without epinephrine. Completed without difficulty  Advised to call if fevers/chills, erythema, induration, drainage, or persistent bleeding.  Images permanently stored and available for review in PACS.  Impression: Technically successful ultrasound guided median nerve hydrodissection.   Independent interpretation of notes and tests performed by another provider:   None.  Brief History, Exam, Impression, and Recommendations:    Carpal tunnel syndrome Recurrence of carpal tunnel symptoms, bilateral, last right-sided median nerve hydrodissection was May 2023, right-sided was July 2023, repeated today bilateral.  Annual physical exam Return end of June this year for fasting annual physical    ____________________________________________ Tracey Marshall. Tracey Marshall, M.D., ABFM., CAQSM., AME. Primary Care and Sports Medicine [facility]  Adjunct Professor of Family Medicine  [place] [employer] [facility] [facility] [employer]  Restaurant manager, fast food

## 2022-11-09 NOTE — Assessment & Plan Note (Signed)
Return end of June this year for fasting annual physical

## 2022-11-09 NOTE — Assessment & Plan Note (Signed)
Recurrence of carpal tunnel symptoms, bilateral, last right-sided median nerve hydrodissection was May 2023, right-sided was July 2023, repeated today bilateral.

## 2022-11-15 DIAGNOSIS — M25532 Pain in left wrist: Secondary | ICD-10-CM | POA: Diagnosis not present

## 2022-11-15 DIAGNOSIS — M25531 Pain in right wrist: Secondary | ICD-10-CM | POA: Diagnosis not present

## 2022-11-22 ENCOUNTER — Ambulatory Visit: Payer: Medicare Other | Admitting: Sports Medicine

## 2022-11-23 DIAGNOSIS — G5603 Carpal tunnel syndrome, bilateral upper limbs: Secondary | ICD-10-CM | POA: Diagnosis not present

## 2022-11-23 DIAGNOSIS — G5623 Lesion of ulnar nerve, bilateral upper limbs: Secondary | ICD-10-CM | POA: Diagnosis not present

## 2022-11-28 DIAGNOSIS — C50412 Malignant neoplasm of upper-outer quadrant of left female breast: Secondary | ICD-10-CM | POA: Diagnosis not present

## 2022-11-28 DIAGNOSIS — Z1231 Encounter for screening mammogram for malignant neoplasm of breast: Secondary | ICD-10-CM | POA: Diagnosis not present

## 2022-11-28 DIAGNOSIS — Z170 Estrogen receptor positive status [ER+]: Secondary | ICD-10-CM | POA: Diagnosis not present

## 2022-11-28 DIAGNOSIS — R92323 Mammographic fibroglandular density, bilateral breasts: Secondary | ICD-10-CM | POA: Diagnosis not present

## 2022-11-28 DIAGNOSIS — Z853 Personal history of malignant neoplasm of breast: Secondary | ICD-10-CM | POA: Diagnosis not present

## 2022-11-28 DIAGNOSIS — Z08 Encounter for follow-up examination after completed treatment for malignant neoplasm: Secondary | ICD-10-CM | POA: Diagnosis not present

## 2022-12-01 DIAGNOSIS — M79602 Pain in left arm: Secondary | ICD-10-CM | POA: Diagnosis not present

## 2022-12-01 DIAGNOSIS — M79601 Pain in right arm: Secondary | ICD-10-CM | POA: Diagnosis not present

## 2022-12-14 DIAGNOSIS — G5601 Carpal tunnel syndrome, right upper limb: Secondary | ICD-10-CM | POA: Diagnosis not present

## 2022-12-14 DIAGNOSIS — G5621 Lesion of ulnar nerve, right upper limb: Secondary | ICD-10-CM | POA: Diagnosis not present

## 2023-01-23 ENCOUNTER — Ambulatory Visit (INDEPENDENT_AMBULATORY_CARE_PROVIDER_SITE_OTHER): Payer: Medicare Other | Admitting: Sports Medicine

## 2023-01-23 ENCOUNTER — Encounter: Payer: Self-pay | Admitting: Sports Medicine

## 2023-01-23 VITALS — BP 124/51 | HR 63 | Ht <= 58 in | Wt 129.0 lb

## 2023-01-23 DIAGNOSIS — E538 Deficiency of other specified B group vitamins: Secondary | ICD-10-CM | POA: Diagnosis not present

## 2023-01-23 DIAGNOSIS — E1169 Type 2 diabetes mellitus with other specified complication: Secondary | ICD-10-CM | POA: Diagnosis not present

## 2023-01-23 DIAGNOSIS — E559 Vitamin D deficiency, unspecified: Secondary | ICD-10-CM

## 2023-01-23 DIAGNOSIS — E669 Obesity, unspecified: Secondary | ICD-10-CM

## 2023-01-23 DIAGNOSIS — L237 Allergic contact dermatitis due to plants, except food: Secondary | ICD-10-CM

## 2023-01-23 DIAGNOSIS — Z0000 Encounter for general adult medical examination without abnormal findings: Secondary | ICD-10-CM

## 2023-01-23 MED ORDER — TRIAMCINOLONE ACETONIDE 0.1 % EX OINT
1.0000 | TOPICAL_OINTMENT | Freq: Two times a day (BID) | CUTANEOUS | 6 refills | Status: DC
Start: 2023-01-23 — End: 2023-11-05

## 2023-01-23 NOTE — Assessment & Plan Note (Signed)
Annual physical as above, checking routine labs. 

## 2023-01-23 NOTE — Addendum Note (Signed)
Addended by: Monica Becton on: 01/23/2023 09:05 AM   Modules accepted: Orders

## 2023-01-23 NOTE — Progress Notes (Addendum)
Subjective:    CC: Annual Physical Exam  HPI:  This patient is here for their annual physical  I reviewed the past medical history, family history, social history, surgical history, and allergies today and no changes were needed.  Please see the problem list section below in epic for further details.  Past Medical History: Past Medical History:  Diagnosis Date   Allergy    Annual physical exam 06/10/2015   Annual physical exam 06/10/2015   Arthritis    CHF (congestive heart failure) (HCC)    CKD (chronic kidney disease) stage 3, GFR 30-59 ml/min (HCC) 12/14/2021   Emphysema lung (HCC) 12/12/2019   Emphysema of lung (HCC)    Hyperlipidemia    Hypertension    Past Surgical History: Past Surgical History:  Procedure Laterality Date   BREAST SURGERY     CESAREAN SECTION     CORONARY ARTERY BYPASS GRAFT     Social History: Social History   Socioeconomic History   Marital status: Widowed    Spouse name: Robbi Garter   Number of children: 4   Years of education: 12   Highest education level: GED or equivalent  Occupational History   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  Vaping Use   Vaping Use: Never used  Substance and Sexual Activity   Alcohol use: No   Drug use: No   Sexual activity: Not Currently    Birth control/protection: Condom, Pill  Other Topics Concern   Not on file  Social History Narrative   Lives alone. Her husband passed away in 2023/03/01. She has four children. She enjoys spending time with her friends.   Social Determinants of Health   Financial Resource Strain: Low Risk  (11/05/2022)   Overall Financial Resource  Strain (CARDIA)    Difficulty of Paying Living Expenses: Not very hard  Food Insecurity: No Food Insecurity (11/05/2022)   Hunger Vital Sign    Worried About Running Out of Food in the Last Year: Never true    Ran Out of Food in the Last Year: Never true  Transportation Needs: No Transportation Needs (11/05/2022)   PRAPARE - Administrator, Civil Service (Medical): No    Lack of Transportation (Non-Medical): No  Physical Activity: Insufficiently Active (11/05/2022)   Exercise Vital Sign    Days of Exercise per Week: 2 days    Minutes of Exercise per Session: 30 min  Stress: No Stress Concern Present (11/05/2022)   [employer] of Occupational Health - Occupational Stress Questionnaire    Feeling of Stress : Not at all  Social Connections: Socially Isolated (11/05/2022)   Social Connection and Isolation Panel [NHANES]    Frequency of Communication with Friends and Family: More than three times a week    Frequency of Social Gatherings with Friends and Family: Once a week    Attends Religious Services: Never    Database administrator or Organizations: No    Attends Banker Meetings: Never    Marital Status: Widowed   Family History: Family History  Problem Relation Age of Onset   Diabetes Sister    Healthy Mother    Healthy Father    Cancer Neg Hx    Hypertension Neg Hx    Allergies: Allergies  Allergen Reactions   Nickel Rash   Influenza Vaccines    Medications: See med rec.  Review of Systems: No headache, visual changes, nausea, vomiting, diarrhea, constipation, dizziness, abdominal pain, skin rash, fevers, chills, night sweats, swollen lymph nodes, weight loss, chest pain, body aches, joint swelling, muscle aches, shortness of breath, mood changes, visual or auditory hallucinations.  Objective:    General: Well Developed, well nourished, and in no acute distress.  Neuro: Alert and oriented x3, extra-ocular muscles intact, sensation grossly intact.  Cranial nerves II through XII are intact, motor, sensory, and coordinative functions are all intact. HEENT: Normocephalic, atraumatic, pupils equal round reactive to light, neck supple, no masses, no lymphadenopathy, thyroid nonpalpable. Oropharynx, nasopharynx, external ear canals are unremarkable. Skin: Warm and dry, no rashes noted.  Cardiac: Regular rate and rhythm, no murmurs rubs or gallops.  Respiratory: Clear to auscultation bilaterally. Not using accessory muscles, speaking in full sentences.  Abdominal: Soft, nontender, nondistended, positive bowel sounds, no masses, no organomegaly.  Musculoskeletal: Shoulder, elbow, wrist, hip, knee, ankle stable, and with full range of motion.  Impression and Recommendations:    The patient was counselled, risk factors were discussed, anticipatory guidance given.  Annual physical exam Annual physical as above, checking routine labs.  Type 2 diabetes mellitus with obesity (HCC) Checking labs today, screening measures.  Poison ivy dermatitis Mild, adding topical triamcinolone.   ____________________________________________ Ihor Austin. Benjamin Stain, M.D., ABFM., CAQSM., AME. Primary Care and Sports Medicine [facility]  Adjunct Professor of Family Medicine  [place] [employer] [facility] [employer]  Restaurant manager, fast food

## 2023-01-23 NOTE — Assessment & Plan Note (Signed)
Mild, adding topical triamcinolone.

## 2023-01-23 NOTE — Assessment & Plan Note (Signed)
Checking labs today, screening measures.

## 2023-01-24 LAB — LIPID PANEL
Cholesterol: 113 mg/dL (ref <200)
HDL: 55 mg/dL (ref >=50)
LDL Cholesterol (Calc): 39 mg/dL (calc)
Non-HDL Cholesterol (Calc): 58 mg/dL (calc) (ref <130)
Total CHOL/HDL Ratio: 2.1 (calc) (ref <5.0)
Triglycerides: 106 mg/dL (ref <150)

## 2023-01-24 LAB — CREATININE WITH EST GFR
Creat: 0.96 mg/dL (ref 0.60–1.00)
eGFR: 62 mL/min/{1.73_m2} (ref >=60)

## 2023-01-24 LAB — CBC
HCT: 41 % (ref 35.0–45.0)
Hemoglobin: 13.2 g/dL (ref 11.7–15.5)
MCH: 29.9 pg (ref 27.0–33.0)
MCHC: 32.2 g/dL (ref 32.0–36.0)
MCV: 92.8 fL (ref 80.0–100.0)
MPV: 9.8 fL (ref 7.5–12.5)
Platelets: 263 10*3/uL (ref 140–400)
RBC: 4.42 10*6/uL (ref 3.80–5.10)
RDW: 13.4 % (ref 11.0–15.0)
WBC: 8.6 10*3/uL (ref 3.8–10.8)

## 2023-01-24 LAB — VITAMIN B12: Vitamin B-12: 1798 pg/mL — ABNORMAL HIGH (ref 200–1100)

## 2023-01-24 LAB — HEMOGLOBIN A1C
Hgb A1c MFr Bld: 6.1 % of total Hgb — ABNORMAL HIGH (ref <5.7)
Mean Plasma Glucose: 128 mg/dL
eAG (mmol/L): 7.1 mmol/L

## 2023-01-24 LAB — COMPREHENSIVE METABOLIC PANEL
AG Ratio: 1.6 (calc) (ref 1.0–2.5)
ALT: 21 U/L (ref 6–29)
AST: 22 U/L (ref 10–35)
Albumin: 4.1 g/dL (ref 3.6–5.1)
Alkaline phosphatase (APISO): 64 U/L (ref 37–153)
BUN: 23 mg/dL (ref 7–25)
CO2: 26 mmol/L (ref 20–32)
Calcium: 9.9 mg/dL (ref 8.6–10.4)
Chloride: 106 mmol/L (ref 98–110)
Creat: 0.96 mg/dL (ref 0.60–1.00)
Globulin: 2.5 g/dL (calc) (ref 1.9–3.7)
Glucose, Bld: 100 mg/dL — ABNORMAL HIGH (ref 65–99)
Potassium: 5 mmol/L (ref 3.5–5.3)
Sodium: 142 mmol/L (ref 135–146)
Total Bilirubin: 0.3 mg/dL (ref 0.2–1.2)
Total Protein: 6.6 g/dL (ref 6.1–8.1)

## 2023-01-24 LAB — VITAMIN D 25 HYDROXY (VIT D DEFICIENCY, FRACTURES): Vit D, 25-Hydroxy: 85 ng/mL (ref 30–100)

## 2023-01-24 LAB — TSH: TSH: 2.99 mIU/L (ref 0.40–4.50)

## 2023-02-02 DIAGNOSIS — G5622 Lesion of ulnar nerve, left upper limb: Secondary | ICD-10-CM | POA: Diagnosis not present

## 2023-02-02 DIAGNOSIS — G5602 Carpal tunnel syndrome, left upper limb: Secondary | ICD-10-CM | POA: Diagnosis not present

## 2023-05-21 ENCOUNTER — Other Ambulatory Visit: Payer: Self-pay | Admitting: Sports Medicine

## 2023-05-21 DIAGNOSIS — R4189 Other symptoms and signs involving cognitive functions and awareness: Secondary | ICD-10-CM

## 2023-05-29 ENCOUNTER — Encounter: Payer: Self-pay | Admitting: Sports Medicine

## 2023-05-29 ENCOUNTER — Ambulatory Visit (INDEPENDENT_AMBULATORY_CARE_PROVIDER_SITE_OTHER): Payer: Medicare Other | Admitting: Sports Medicine

## 2023-05-29 VITALS — BP 109/63 | HR 66

## 2023-05-29 DIAGNOSIS — H9213 Otorrhea, bilateral: Secondary | ICD-10-CM | POA: Diagnosis not present

## 2023-05-29 DIAGNOSIS — R4189 Other symptoms and signs involving cognitive functions and awareness: Secondary | ICD-10-CM | POA: Diagnosis not present

## 2023-05-29 DIAGNOSIS — Z0000 Encounter for general adult medical examination without abnormal findings: Secondary | ICD-10-CM

## 2023-05-29 NOTE — Assessment & Plan Note (Signed)
Return in June 2025 for fasting annual physical and Mini-Mental status exam.

## 2023-05-29 NOTE — Assessment & Plan Note (Signed)
Continued mild cognitive impairment type symptoms, we will do a full Mini-Mental status exam at her next annual physical.

## 2023-05-29 NOTE — Assessment & Plan Note (Signed)
Pleasant 72 year old female, she got new hearing aids and then noticed some clear drainage from both ears, a bit of fullness in the right ear. This has since resolved on its own. Her exam is completely unremarkable, there is a trace amount of wax along the canal but no evidence of erythema, no abnormalities in the tympanic membrane. She does desire a different set of hearing aids, and happy to do an audiology referral.

## 2023-05-29 NOTE — Progress Notes (Signed)
    Procedures performed today:    None.  Independent interpretation of notes and tests performed by another provider:   None.  Brief History, Exam, Impression, and Recommendations:    Ear drainage, bilateral Tracey 72 year old Marshall, she got new hearing aids and then noticed some clear drainage from both ears, a bit of fullness in the right ear. This has since resolved on its own. Her exam is completely unremarkable, there is a trace amount of wax along the canal but no evidence of erythema, no abnormalities in the tympanic membrane. She does desire a different set of hearing aids, and happy to do an audiology referral.  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   ____________________________________________ Ihor Austin. Benjamin Stain, M.D., ABFM., CAQSM., AME. Primary Care and Sports Medicine [facility]  Adjunct Professor of Family Medicine  [place] [employer] [facility] [employer]  Restaurant manager, fast food

## 2023-06-21 ENCOUNTER — Other Ambulatory Visit: Payer: Self-pay | Admitting: Sports Medicine

## 2023-06-21 DIAGNOSIS — I10 Essential (primary) hypertension: Secondary | ICD-10-CM

## 2023-07-04 DIAGNOSIS — R0982 Postnasal drip: Secondary | ICD-10-CM | POA: Diagnosis not present

## 2023-07-04 DIAGNOSIS — H906 Mixed conductive and sensorineural hearing loss, bilateral: Secondary | ICD-10-CM | POA: Diagnosis not present

## 2023-07-04 DIAGNOSIS — H6123 Impacted cerumen, bilateral: Secondary | ICD-10-CM | POA: Diagnosis not present

## 2023-07-19 ENCOUNTER — Encounter: Payer: Self-pay | Admitting: Sports Medicine

## 2023-07-19 NOTE — Telephone Encounter (Signed)
 Care team updated and letter sent for eye exam notes.

## 2023-07-31 ENCOUNTER — Ambulatory Visit (INDEPENDENT_AMBULATORY_CARE_PROVIDER_SITE_OTHER): Payer: Medicare Other | Admitting: Family Medicine

## 2023-07-31 ENCOUNTER — Encounter: Payer: Self-pay | Admitting: Family Medicine

## 2023-07-31 VITALS — Ht <= 58 in | Wt 125.0 lb

## 2023-07-31 DIAGNOSIS — Z0000 Encounter for general adult medical examination without abnormal findings: Secondary | ICD-10-CM | POA: Diagnosis not present

## 2023-07-31 NOTE — Progress Notes (Signed)
 Subjective:   Tracey [place] is a 72 y.o. female who presents for Medicare Annual (Subsequent) preventive examination.  Visit Complete: Virtual I connected with  Rock Sharps on 07/31/23 by a audio enabled telemedicine application and verified that I am speaking with the correct person using two identifiers.  Patient Location: Home  Provider Location: Office/Clinic  I discussed the limitations of evaluation and management by telemedicine. The patient expressed understanding and agreed to proceed.  Vital Signs: Because this visit was a virtual/telehealth visit, some criteria may be missing or patient reported. Any vitals not documented were not able to be obtained and vitals that have been documented are patient reported.  Patient Medicare AWV questionnaire was completed by the patient on n/a ; I have confirmed that all information answered by patient is correct and no changes since this date.  Cardiac Risk Factors include: advanced age (>64men, >52 women);dyslipidemia;hypertension     Objective:    Today's Vitals   07/31/23 0852  Weight: 125 lb (56.7 kg)  Height: 4' 8 (1.422 m)   Body mass index is 28.02 kg/m.     07/31/2023    9:04 AM 07/27/2022    8:58 AM 07/22/2021    8:12 AM 06/11/2015    3:12 PM  Advanced Directives  Does Patient Have a Medical Advance Directive? Yes Yes Yes No  Type of Estate Agent of [place];Living will Living will Living will;Healthcare Power of Attorney   Does patient want to make changes to medical advance directive? No - Patient declined No - Patient declined No - Patient declined   Copy of Healthcare Power of Attorney in Chart?   No - copy requested   Would patient like information on creating a medical advance directive?    No - patient declined information    Current Medications (verified) Outpatient Encounter Medications as of 07/31/2023  Medication Sig   aspirin  EC 81 MG tablet Take 1 tablet (81 mg total) by mouth  daily.   Calcium -Phosphorus -Vitamin D  250-100-500 MG-MG-UNIT CHEW Chew by mouth.   carvedilol  (COREG ) 3.125 MG tablet TAKE 1 TABLET BY MOUTH TWICE  DAILY WITH MEALS   diclofenac  Sodium (VOLTAREN ) 1 % GEL Apply 2 g topically 4 (four) times daily. To affected joint.   fluocinonide  (LIDEX ) 0.05 % external solution Apply 1 application topically 2 (two) times daily. To scalp   fluticasone  (FLONASE ) 50 MCG/ACT nasal spray USE 1 SPRAY IN EACH NOSTRIL 2 TIMES A DAY USE LEFT HAND FOR RIGHT NOSTRIL AND RIGHT HAND FOR LEFT NOSTRIL.   lisinopril  (ZESTRIL ) 40 MG tablet TAKE 1 TABLET BY MOUTH DAILY   meclizine  (ANTIVERT ) 25 MG tablet Take 1 tablet (25 mg total) by mouth 3 (three) times daily as needed for dizziness.   omeprazole  (PRILOSEC) 40 MG capsule Take 1 capsule (40 mg total) by mouth 2 (two) times daily. Take in the morning and at dinnertime.   rosuvastatin  (CRESTOR ) 40 MG tablet Take by mouth.   sertraline  (ZOLOFT ) 100 MG tablet Take 1 tablet by mouth once daily   ezetimibe (ZETIA) 10 MG tablet Take 10 mg by mouth daily. (Patient not taking: Reported on 07/31/2023)   triamcinolone  ointment (KENALOG ) 0.1 % Apply 1 Application topically 2 (two) times daily. To affected areas (Patient not taking: Reported on 07/31/2023)   No facility-administered encounter medications on file as of 07/31/2023.    Allergies (verified) Nickel and Influenza vaccines   History: Past Medical History:  Diagnosis Date   Allergy    Annual physical  exam 06/10/2015   Annual physical exam 06/10/2015   Arthritis    CHF (congestive heart failure) (HCC)    CKD (chronic kidney disease) stage 3, GFR 30-59 ml/min (HCC) 12/14/2021   Emphysema lung (HCC) 12/12/2019   Emphysema of lung (HCC)    Hyperlipidemia    Hypertension    Past Surgical History:  Procedure Laterality Date   BREAST SURGERY     CESAREAN SECTION     CORONARY ARTERY BYPASS GRAFT     Family History  Problem Relation Age of Onset   Diabetes Sister     Healthy Mother    Healthy Father    Cancer Neg Hx    Hypertension Neg Hx    Social History   Socioeconomic History   Marital status: Widowed    Spouse name: Madaline   Number of children: 4   Years of education: 12   Highest education level: GED or equivalent  Occupational History   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  Vaping Use   Vaping status: Never Used  Substance and Sexual Activity   Alcohol use: No   Drug use: No   Sexual activity: Not Currently    Birth control/protection: Condom, Pill  Other Topics Concern   Not on file  Social History Narrative   Lives alone. Her husband passed away in 11-Feb-2024. She has four children. She enjoys spending time with her friends.   Social Drivers of Corporate Investment Banker Strain: Low Risk  (07/31/2023)   Overall Financial Resource Strain (CARDIA)    Difficulty of Paying Living Expenses: Not hard at all  Food Insecurity: No Food Insecurity (07/31/2023)   Hunger Vital Sign    Worried About Running Out of Food in the Last Year: Never true    Ran Out of Food in the Last Year: Never true  Transportation Needs: No Transportation Needs (07/31/2023)   PRAPARE - Administrator, Civil Service (Medical): No    Lack of Transportation (Non-Medical): No  Physical Activity: Sufficiently Active (07/31/2023)   Exercise Vital Sign    Days of Exercise per Week: 5 days    Minutes of Exercise per Session: 40 min  Recent Concern: Physical Activity - Insufficiently Active (05/22/2023)   Exercise Vital Sign    Days of Exercise per Week: 2 days    Minutes of Exercise per  Session: 60 min  Stress: No Stress Concern Present (07/31/2023)   [employer] of Occupational Health - Occupational Stress Questionnaire    Feeling of Stress : Not at all  Social Connections: Socially Isolated (07/31/2023)   Social Connection and Isolation Panel [NHANES]    Frequency of Communication with Friends and Family: More than three times a week    Frequency of Social Gatherings with Friends and Family: Once a week    Attends Religious Services: Never    Database Administrator or Organizations: No    Attends Banker Meetings: Never    Marital Status: Widowed    Tobacco Counseling Counseling given: Not  Answered Tobacco comments: Best way to quit is cold turkey.   Clinical Intake:  Pre-visit preparation completed: No  Pain : No/denies pain     BMI - recorded: 28 Nutritional Status: BMI 25 -29 Overweight Nutritional Risks: None Diabetes: No (lost weight and took care of it)  How often do you need to have someone help you when you read instructions, pamphlets, or other written materials from your doctor or pharmacy?: 1 - Never What is the last grade level you completed in school?: 12  Interpreter Needed?: No      Activities of Daily Living    07/31/2023    8:55 AM 07/24/2023    1:10 PM  In your present state of health, do you have any difficulty performing the following activities:  Hearing? 1 1  Comment waiting for ENT appointment.   Vision? 0 0  Comment wears glasses, no issues   Difficulty concentrating or making decisions? 0 1  Walking or climbing stairs? 0 0  Dressing or bathing? 0 0  Doing errands, shopping? 0 0  Preparing Food and eating ?  N  Using the Toilet? N N  In the past six months, have you accidently leaked urine? N N  Do you have problems with loss of bowel control? N N  Managing your Medications? N N  Managing your Finances? N N  Housekeeping or managing your Housekeeping? N N    Patient Care  Team: Curtis Debby PARAS, MD as PCP - General (Family Medicine) [facility] [place]) [facility]) [place], Dr. Oncology Inocencio Agent, MD, orthopedics  [facility] provider in [place] (name unknown)      Indicate any recent Medical Services you may have received from other than [facility] providers in the past year (date may be approximate).     Assessment:   This is a routine wellness examination for [place].  Hearing/Vision screen Hearing Screening - Comments:: Unable to test, awaiting appointment with specialist for evaluation.  Vision Screening - Comments:: Unable to test, wears glasses, no issues   Goals Addressed             This Visit's Progress    Exercise 150 min/wk Moderate Activity       Walk for 30 minutes per day when weather permits.       Depression Screen    07/31/2023    9:03 AM 07/27/2022    8:59 AM 05/23/2022    9:48 AM 04/11/2022   10:25 AM 07/22/2021    8:13 AM 05/13/2021    1:45 PM 01/23/2018    9:47 AM  PHQ 2/9 Scores  PHQ - 2 Score 0 0 2 2 0 0 4  PHQ- 9 Score   7 5   7     Fall Risk    07/31/2023    9:05 AM 07/24/2023    1:10 PM 07/27/2022    8:59 AM 07/25/2022   11:13 AM 01/18/2022   10:40 AM  Fall Risk   Falls in the past year? 1 0 0 0 0  Number falls in past yr: 0 0 0 0 0  Injury with Fall? 0 0 0 0 0  Risk for fall due to : --  No Fall Risks  No Fall Risks  Risk for fall due to: Comment was standing on sofa, swiping an insect and fell off the sofa.      Follow up   Falls evaluation completed  Falls prevention discussed;Falls evaluation completed    MEDICARE RISK AT  HOME: Medicare Risk at Home Any stairs in or around the home?: Yes If so, are there any without handrails?: Yes Home free of loose throw rugs in walkways, pet beds, electrical cords, etc?: Yes Adequate lighting in your home to reduce risk of falls?: Yes Life alert?: No Use of a cane, walker or w/c?: No Grab bars in the bathroom?: Yes Shower  chair or bench in shower?: Yes Elevated toilet seat or a handicapped toilet?: No  TIMED UP AND GO:  Was the test performed?  No    Cognitive Function:        07/31/2023    9:07 AM 07/27/2022    9:03 AM 04/11/2022   10:00 AM 07/22/2021    8:20 AM  6CIT Screen  What Year? 0 points 0 points 0 points 0 points  What month? 0 points 0 points 0 points 0 points  What time? 0 points 0 points 0 points 0 points  Count back from 20 0 points 0 points 0 points 0 points  Months in reverse 0 points 0 points 0 points 0 points  Repeat phrase 0 points 0 points 0 points 0 points  Total Score 0 points 0 points 0 points 0 points    Immunizations Immunization History  Administered Date(s) Administered   PFIZER(Purple Top)SARS-COV-2 Vaccination 04/19/2020, 04/19/2020, 05/11/2020   PPD Test 10/31/2016, 05/15/2017, 08/09/2020   Pfizer(Comirnaty)Fall Seasonal Vaccine 12 years and older 05/23/2022   Pneumococcal Conjugate-13 06/10/2015   Pneumococcal Polysaccharide-23 12/12/2016   Tdap 06/10/2015    TDAP status: Up to date  Flu Vaccine status: Declined, Education has been provided regarding the importance of this vaccine but patient still declined. Advised may receive this vaccine at local pharmacy or Health Dept. Aware to provide a copy of the vaccination record if obtained from local pharmacy or Health Dept. Verbalized acceptance and understanding. Allergic to flu vaccine.   Pneumococcal vaccine status: Up to date  Covid-19 vaccine status: Information provided on how to obtain vaccines.   Qualifies for Shingles Vaccine? Yes   Zostavax completed No   Shingrix Completed?: No.    Education has been provided regarding the importance of this vaccine. Patient has been advised to call insurance company to determine out of pocket expense if they have not yet received this vaccine. Advised may also receive vaccine at local pharmacy or Health Dept. Verbalized acceptance and understanding.  Screening  Tests Health Maintenance  Topic Date Due   Diabetic kidney evaluation - Urine ACR  Never done   Zoster Vaccines- Shingrix (1 of 2) Never done   FOOT EXAM  05/13/2022   HEMOGLOBIN A1C  07/25/2023   OPHTHALMOLOGY EXAM  09/23/2023   MAMMOGRAM  11/18/2023   Diabetic kidney evaluation - eGFR measurement  01/23/2024   Medicare Annual Wellness (AWV)  07/30/2024   DTaP/Tdap/Td (2 - Td or Tdap) 06/09/2025   Colonoscopy  08/30/2025   Pneumonia Vaccine 75+ Years old  Completed   DEXA SCAN  Completed   Hepatitis C Screening  Completed   HPV VACCINES  Aged Out   INFLUENZA VACCINE  Discontinued   COVID-19 Vaccine  Discontinued    Health Maintenance  Health Maintenance Due  Topic Date Due   Diabetic kidney evaluation - Urine ACR  Never done   Zoster Vaccines- Shingrix (1 of 2) Never done   FOOT EXAM  05/13/2022   HEMOGLOBIN A1C  07/25/2023    Colorectal cancer screening: Type of screening: Colonoscopy. Completed 08/31/2015. Repeat every n/a due to age years  Mammogram status: Completed 11/17/2021. Repeat every  2 years.  Bone Density status: Completed 08/03/2021. Results reflect: Bone density results: OSTEOPOROSIS. Repeat every 2 years.  Lung Cancer Screening: (Low Dose CT Chest recommended if Age 69-80 years, 20 pack-year currently smoking OR have quit w/in 15years.) does not qualify.   Lung Cancer Screening Referral: quit 16 years ago.   Additional Screening:  Hepatitis C Screening: does qualify; Completed 12/12/2016  Vision Screening: Recommended annual ophthalmology exams for early detection of glaucoma and other disorders of the eye. Is the patient up to date with their annual eye exam?  Yes  Who is the provider or what is the name of the office in which the patient attends annual eye exams? Provider in [place] If pt is not established with a provider, would they like to be referred to a provider to establish care? No .   Dental Screening: Recommended annual dental exams for  proper oral hygiene  Diabetic Foot Exam: Diabetic Foot Exam: Overdue, Pt has been advised about the importance in completing this exam. Pt is scheduled for diabetic foot exam on unsure.  Community Resource Referral / Chronic Care Management: CRR required this visit?  No   CCM required this visit?  No     Plan:     I have personally reviewed and noted the following in the patient's chart:   Medical and social history Use of alcohol, tobacco or illicit drugs  Current medications and supplements including opioid prescriptions. Patient is not currently taking opioid prescriptions. Functional ability and status Nutritional status Physical activity Advanced directives List of other physicians Hospitalizations, surgeries, and ER visits in previous 12 months: Carpal tunnel surgery in 2024.  Vitals Screenings to include cognitive, depression, and falls Referrals and appointments  In addition, I have reviewed and discussed with patient certain preventive protocols, quality metrics, and best practice recommendations. A written personalized care plan for preventive services as well as general preventive health recommendations were provided to patient.     Darice JONELLE Brownie, FNP   07/31/2023   After Visit Summary: (MyChart) Due to this being a telephonic visit, the after visit summary with patients personalized plan was offered to patient via MyChart   Follow-up with as scheduled. Covid vaccine at local pharmacy. Foot exam if indicated per PCP.  Declines influenza due to history of anaphylaxis reaction.

## 2023-08-07 DIAGNOSIS — H7093 Unspecified mastoiditis, bilateral: Secondary | ICD-10-CM | POA: Diagnosis not present

## 2023-08-07 DIAGNOSIS — J3089 Other allergic rhinitis: Secondary | ICD-10-CM | POA: Diagnosis not present

## 2023-08-14 ENCOUNTER — Telehealth: Payer: Self-pay

## 2023-08-14 ENCOUNTER — Other Ambulatory Visit: Payer: Self-pay | Admitting: Sports Medicine

## 2023-08-14 DIAGNOSIS — Z9889 Other specified postprocedural states: Secondary | ICD-10-CM | POA: Diagnosis not present

## 2023-08-14 DIAGNOSIS — N644 Mastodynia: Secondary | ICD-10-CM | POA: Diagnosis not present

## 2023-08-14 DIAGNOSIS — M810 Age-related osteoporosis without current pathological fracture: Secondary | ICD-10-CM | POA: Diagnosis not present

## 2023-08-14 DIAGNOSIS — Z780 Asymptomatic menopausal state: Secondary | ICD-10-CM | POA: Diagnosis not present

## 2023-08-14 DIAGNOSIS — N63 Unspecified lump in unspecified breast: Secondary | ICD-10-CM | POA: Diagnosis not present

## 2023-08-14 DIAGNOSIS — Z170 Estrogen receptor positive status [ER+]: Secondary | ICD-10-CM | POA: Diagnosis not present

## 2023-08-14 DIAGNOSIS — N6320 Unspecified lump in the left breast, unspecified quadrant: Secondary | ICD-10-CM | POA: Diagnosis not present

## 2023-08-14 DIAGNOSIS — C50412 Malignant neoplasm of upper-outer quadrant of left female breast: Secondary | ICD-10-CM | POA: Diagnosis not present

## 2023-08-14 DIAGNOSIS — R1013 Epigastric pain: Secondary | ICD-10-CM

## 2023-08-14 NOTE — Telephone Encounter (Signed)
 Copied from CRM [id number]. Topic: Clinical - Medication Question >> Aug 13, 2023 11:21 AM Joesph PARAS wrote: Reason for CRM: Patient is calling to state that she received a message from what she thinks is [facility]. She states they are requesting to review all current medications.

## 2023-08-15 NOTE — Telephone Encounter (Signed)
 Requesting rx rf of omeprazole  40mg  Last written 06/14/2022 Last OV 05/29/2023 Upcoming appt =none

## 2023-08-16 MED ORDER — OMEPRAZOLE 40 MG PO CPDR: 40.0000 mg | DELAYED_RELEASE_CAPSULE | Freq: Two times a day (BID) | ORAL | 0 refills | Status: DC

## 2023-08-21 LAB — HM MAMMOGRAPHY

## 2023-08-29 DIAGNOSIS — Z170 Estrogen receptor positive status [ER+]: Secondary | ICD-10-CM | POA: Diagnosis not present

## 2023-08-29 DIAGNOSIS — N644 Mastodynia: Secondary | ICD-10-CM | POA: Diagnosis not present

## 2023-08-29 DIAGNOSIS — N6320 Unspecified lump in the left breast, unspecified quadrant: Secondary | ICD-10-CM | POA: Diagnosis not present

## 2023-08-29 DIAGNOSIS — C50412 Malignant neoplasm of upper-outer quadrant of left female breast: Secondary | ICD-10-CM | POA: Diagnosis not present

## 2023-08-29 DIAGNOSIS — N6322 Unspecified lump in the left breast, upper inner quadrant: Secondary | ICD-10-CM | POA: Diagnosis not present

## 2023-08-29 DIAGNOSIS — R92323 Mammographic fibroglandular density, bilateral breasts: Secondary | ICD-10-CM | POA: Diagnosis not present

## 2023-08-29 DIAGNOSIS — N63 Unspecified lump in unspecified breast: Secondary | ICD-10-CM | POA: Diagnosis not present

## 2023-08-29 DIAGNOSIS — R92322 Mammographic fibroglandular density, left breast: Secondary | ICD-10-CM | POA: Diagnosis not present

## 2023-08-30 ENCOUNTER — Other Ambulatory Visit: Payer: Self-pay | Admitting: Sports Medicine

## 2023-08-30 DIAGNOSIS — R1013 Epigastric pain: Secondary | ICD-10-CM

## 2023-09-06 DIAGNOSIS — Z170 Estrogen receptor positive status [ER+]: Secondary | ICD-10-CM | POA: Diagnosis not present

## 2023-09-06 DIAGNOSIS — N649 Disorder of breast, unspecified: Secondary | ICD-10-CM | POA: Diagnosis not present

## 2023-09-06 DIAGNOSIS — R92322 Mammographic fibroglandular density, left breast: Secondary | ICD-10-CM | POA: Diagnosis not present

## 2023-09-06 DIAGNOSIS — C50412 Malignant neoplasm of upper-outer quadrant of left female breast: Secondary | ICD-10-CM | POA: Diagnosis not present

## 2023-09-06 DIAGNOSIS — N6322 Unspecified lump in the left breast, upper inner quadrant: Secondary | ICD-10-CM | POA: Diagnosis not present

## 2023-09-06 DIAGNOSIS — N644 Mastodynia: Secondary | ICD-10-CM | POA: Diagnosis not present

## 2023-09-06 DIAGNOSIS — N641 Fat necrosis of breast: Secondary | ICD-10-CM | POA: Diagnosis not present

## 2023-09-14 ENCOUNTER — Telehealth: Payer: Self-pay

## 2023-09-14 NOTE — Telephone Encounter (Signed)
Patient came by office to drop of [employer] [employer] form to be completed by Dr. Karie Schwalbe, patient scheduled for 2/19, thanks.

## 2023-09-18 DIAGNOSIS — M7989 Other specified soft tissue disorders: Secondary | ICD-10-CM | POA: Diagnosis not present

## 2023-09-19 ENCOUNTER — Ambulatory Visit (INDEPENDENT_AMBULATORY_CARE_PROVIDER_SITE_OTHER): Payer: Medicare Other | Admitting: Sports Medicine

## 2023-09-19 DIAGNOSIS — R4189 Other symptoms and signs involving cognitive functions and awareness: Secondary | ICD-10-CM

## 2023-09-19 DIAGNOSIS — E1169 Type 2 diabetes mellitus with other specified complication: Secondary | ICD-10-CM

## 2023-09-19 DIAGNOSIS — E1121 Type 2 diabetes mellitus with diabetic nephropathy: Secondary | ICD-10-CM | POA: Diagnosis not present

## 2023-09-19 DIAGNOSIS — E538 Deficiency of other specified B group vitamins: Secondary | ICD-10-CM | POA: Diagnosis not present

## 2023-09-19 DIAGNOSIS — G309 Alzheimer's disease, unspecified: Secondary | ICD-10-CM | POA: Diagnosis not present

## 2023-09-19 DIAGNOSIS — E559 Vitamin D deficiency, unspecified: Secondary | ICD-10-CM

## 2023-09-19 DIAGNOSIS — E669 Obesity, unspecified: Secondary | ICD-10-CM

## 2023-09-19 NOTE — Assessment & Plan Note (Addendum)
Getting updated diabetic labs including microalbumin. We have filled out her insurance form confirming diabetes multiple times, I signed it and gave it to her in hand today.

## 2023-09-19 NOTE — Assessment & Plan Note (Signed)
Increasing cognitive impairment, difficulty finding words and losing train of thought, she is not getting lost in familiar surroundings, not leaving the stove or iron on. She does a cup of caffeinated coffee daily. We will go and pull the trigger for a dementia workup including imaging, I would also like her to touch base with [place] neurology for additional evaluation.

## 2023-09-19 NOTE — Progress Notes (Addendum)
    Procedures performed today:    None.  Independent interpretation of notes and tests performed by another provider:   None.  Brief History, Exam, Impression, and Recommendations:    Type 2 diabetes mellitus with obesity (HCC) Getting updated diabetic labs including microalbumin. We have filled out her insurance form confirming diabetes multiple times, I signed it and gave it to her in hand today.  Pseudodementia Increasing cognitive impairment, difficulty finding words and losing train of thought, she is not getting lost in familiar surroundings, not leaving the stove or iron on. She does a cup of caffeinated coffee daily. We will go and pull the trigger for a dementia workup including imaging, I would also like her to touch base with [place] neurology for additional evaluation.  Diabetic nephropathy associated with type 2 diabetes mellitus (HCC) Moderately elevated urine microalbumin creatinine ratio, she is on lisinopril , not yet on an SGLT2, I would like nephrology to weigh in.  I spent 30 minutes of total time managing this patient today, this includes chart review, face to face, and non-face to face time.  ____________________________________________ Debby PARAS. Curtis, M.D., ABFM., CAQSM., AME. Primary Care and Sports Medicine [facility]  Adjunct Professor of [facility] Medicine  [employer]  Restaurant Manager, [employer]

## 2023-09-20 ENCOUNTER — Encounter: Payer: Self-pay | Admitting: Physician Assistant

## 2023-09-20 ENCOUNTER — Telehealth: Payer: Self-pay | Admitting: Neurology

## 2023-09-20 DIAGNOSIS — E1121 Type 2 diabetes mellitus with diabetic nephropathy: Secondary | ICD-10-CM | POA: Insufficient documentation

## 2023-09-20 LAB — COMPREHENSIVE METABOLIC PANEL
ALT: 25 [IU]/L (ref 0–32)
AST: 27 [IU]/L (ref 0–40)
Albumin: 4.1 g/dL (ref 3.8–4.8)
Alkaline Phosphatase: 67 [IU]/L (ref 44–121)
BUN/Creatinine Ratio: 12 (ref 12–28)
BUN: 14 mg/dL (ref 8–27)
Bilirubin Total: 0.3 mg/dL (ref 0.0–1.2)
CO2: 24 mmol/L (ref 20–29)
Calcium: 10.1 mg/dL (ref 8.7–10.3)
Chloride: 105 mmol/L (ref 96–106)
Creatinine, Ser: 1.17 mg/dL — ABNORMAL HIGH (ref 0.57–1.00)
Globulin, Total: 2.6 g/dL (ref 1.5–4.5)
Glucose: 88 mg/dL (ref 70–99)
Potassium: 4.9 mmol/L (ref 3.5–5.2)
Sodium: 144 mmol/L (ref 134–144)
Total Protein: 6.7 g/dL (ref 6.0–8.5)
eGFR: 49 mL/min/{1.73_m2} — ABNORMAL LOW (ref >59)

## 2023-09-20 LAB — SEDIMENTATION RATE: Sed Rate: 17 mm/h (ref 0–40)

## 2023-09-20 LAB — TSH: TSH: 2.95 u[IU]/mL (ref 0.450–4.500)

## 2023-09-20 LAB — VITAMIN D 25 HYDROXY (VIT D DEFICIENCY, FRACTURES): Vit D, 25-Hydroxy: 104 ng/mL — ABNORMAL HIGH (ref 30.0–100.0)

## 2023-09-20 LAB — CBC
Hematocrit: 41.2 % (ref 34.0–46.6)
Hemoglobin: 13.1 g/dL (ref 11.1–15.9)
MCH: 29.4 pg (ref 26.6–33.0)
MCHC: 31.8 g/dL (ref 31.5–35.7)
MCV: 93 fL (ref 79–97)
Platelets: 227 10*3/uL (ref 150–450)
RBC: 4.45 x10E6/uL (ref 3.77–5.28)
RDW: 13.1 % (ref 11.7–15.4)
WBC: 7.6 10*3/uL (ref 3.4–10.8)

## 2023-09-20 LAB — HEMOGLOBIN A1C
Est. average glucose Bld gHb Est-mCnc: 126 mg/dL
Hgb A1c MFr Bld: 6 % — ABNORMAL HIGH (ref 4.8–5.6)

## 2023-09-20 LAB — LIPID PANEL
Chol/HDL Ratio: 2.1 {ratio} (ref 0.0–4.4)
Cholesterol, Total: 102 mg/dL (ref 100–199)
HDL: 49 mg/dL (ref >39)
LDL Chol Calc (NIH): 32 mg/dL (ref 0–99)
Triglycerides: 116 mg/dL (ref 0–149)
VLDL Cholesterol Cal: 21 mg/dL (ref 5–40)

## 2023-09-20 LAB — MICROALBUMIN / CREATININE URINE RATIO
Creatinine, Urine: 72.7 mg/dL
Microalb/Creat Ratio: 157 mg/g{creat} — ABNORMAL HIGH (ref 0–29)
Microalbumin, Urine: 114.4 ug/mL

## 2023-09-20 LAB — FOLATE: Folate: 17.1 ng/mL (ref >3.0)

## 2023-09-20 LAB — VITAMIN B12: Vitamin B-12: 1082 pg/mL (ref 232–1245)

## 2023-09-20 LAB — RPR: RPR Ser Ql: NONREACTIVE

## 2023-09-20 NOTE — Telephone Encounter (Signed)
Left message with the after hour service on 09-20-23   Caller is returning a call to the office

## 2023-09-20 NOTE — Addendum Note (Signed)
Addended by: Monica Becton on: 09/20/2023 01:48 PM   Modules accepted: Orders

## 2023-09-20 NOTE — Telephone Encounter (Signed)
Patient was calling about a referral  that we received to our office

## 2023-09-20 NOTE — Assessment & Plan Note (Signed)
Moderately elevated urine microalbumin creatinine ratio, she is on lisinopril, not yet on an SGLT2, I would like nephrology to weigh in.

## 2023-09-20 NOTE — Telephone Encounter (Signed)
Pls see what she needs, she is not established in our office, she has a new patient appt with [facility] in March, was she asking about that referral? We cannot advise her of anything until she is seen. Thanks

## 2023-09-24 ENCOUNTER — Telehealth: Payer: Self-pay | Admitting: Osteopathic Medicine

## 2023-09-24 NOTE — Telephone Encounter (Signed)
 Copied from CRM [id number]. Topic: General - Other >> Sep 21, 2023  5:04 PM Tracey [place] wrote: Reason for CRM: Patient got a different fax number for [employer] [phone]

## 2023-10-01 ENCOUNTER — Ambulatory Visit: Payer: Medicare Other

## 2023-10-01 DIAGNOSIS — H0520 Unspecified exophthalmos: Secondary | ICD-10-CM

## 2023-10-01 DIAGNOSIS — F0280 Dementia in other diseases classified elsewhere without behavioral disturbance: Secondary | ICD-10-CM | POA: Diagnosis not present

## 2023-10-01 DIAGNOSIS — G309 Alzheimer's disease, unspecified: Secondary | ICD-10-CM | POA: Diagnosis not present

## 2023-10-01 DIAGNOSIS — E119 Type 2 diabetes mellitus without complications: Secondary | ICD-10-CM | POA: Diagnosis not present

## 2023-10-01 DIAGNOSIS — C50919 Malignant neoplasm of unspecified site of unspecified female breast: Secondary | ICD-10-CM | POA: Diagnosis not present

## 2023-10-01 DIAGNOSIS — E1169 Type 2 diabetes mellitus with other specified complication: Secondary | ICD-10-CM

## 2023-10-01 DIAGNOSIS — I6782 Cerebral ischemia: Secondary | ICD-10-CM | POA: Diagnosis not present

## 2023-10-01 MED ORDER — GADOBUTROL 1 MMOL/ML IV SOLN
5.5000 mL | Freq: Once | INTRAVENOUS | Status: AC | PRN
  Administered 2023-10-01: 5.5 mL via INTRAVENOUS

## 2023-10-10 ENCOUNTER — Encounter: Payer: Self-pay | Admitting: Physician Assistant

## 2023-10-25 ENCOUNTER — Other Ambulatory Visit: Payer: Self-pay | Admitting: Sports Medicine

## 2023-10-25 DIAGNOSIS — R1013 Epigastric pain: Secondary | ICD-10-CM

## 2023-10-31 DIAGNOSIS — C50412 Malignant neoplasm of upper-outer quadrant of left female breast: Secondary | ICD-10-CM | POA: Diagnosis not present

## 2023-10-31 DIAGNOSIS — Z170 Estrogen receptor positive status [ER+]: Secondary | ICD-10-CM | POA: Diagnosis not present

## 2023-10-31 DIAGNOSIS — M810 Age-related osteoporosis without current pathological fracture: Secondary | ICD-10-CM | POA: Diagnosis not present

## 2023-11-05 ENCOUNTER — Encounter: Payer: Self-pay | Admitting: Sports Medicine

## 2023-11-05 ENCOUNTER — Ambulatory Visit (INDEPENDENT_AMBULATORY_CARE_PROVIDER_SITE_OTHER): Admitting: Sports Medicine

## 2023-11-05 VITALS — BP 115/62 | HR 58 | Resp 20 | Ht <= 58 in | Wt 127.6 lb

## 2023-11-05 DIAGNOSIS — Z0000 Encounter for general adult medical examination without abnormal findings: Secondary | ICD-10-CM

## 2023-11-05 DIAGNOSIS — R4189 Other symptoms and signs involving cognitive functions and awareness: Secondary | ICD-10-CM | POA: Diagnosis not present

## 2023-11-05 DIAGNOSIS — I10 Essential (primary) hypertension: Secondary | ICD-10-CM | POA: Diagnosis not present

## 2023-11-05 MED ORDER — SERTRALINE HCL 100 MG PO TABS: 100.0000 mg | ORAL_TABLET | Freq: Every day | ORAL | 3 refills | Status: AC

## 2023-11-05 MED ORDER — CARVEDILOL 3.125 MG PO TABS: 3.1250 mg | ORAL_TABLET | Freq: Two times a day (BID) | ORAL | 3 refills | Status: AC

## 2023-11-05 MED ORDER — CALCIUM-PHOSPHORUS-VITAMIN D 250-100-500 MG-MG-UNIT PO CHEW: CHEWABLE_TABLET | ORAL | 3 refills | Status: AC

## 2023-11-05 MED ORDER — LISINOPRIL 40 MG PO TABS: 40.0000 mg | ORAL_TABLET | Freq: Every day | ORAL | 3 refills | Status: AC

## 2023-11-05 MED ORDER — ROSUVASTATIN CALCIUM 40 MG PO TABS: 40.0000 mg | ORAL_TABLET | Freq: Every day | ORAL | 3 refills | Status: AC

## 2023-11-05 NOTE — Assessment & Plan Note (Signed)
 Patient is here for refills, she did not really need to come in for this, she has requested that we remove all nonessential medications from her medication list as she is looking to minimize pharmacologic intervention, I have removed the pain medications as those are not necessary to prolong the life, as well as some medications for rash and GERD.

## 2023-11-05 NOTE — Progress Notes (Signed)
    Procedures performed today:    None.  Independent interpretation of notes and tests performed by another provider:   None.  Brief History, Exam, Impression, and Recommendations:    Annual physical exam Patient is here for refills, she did not really need to come in for this, she has requested that we remove all nonessential medications from her medication list as she is looking to minimize pharmacologic intervention, I have removed the pain medications as those are not necessary to prolong the life, as well as some medications for rash and GERD.    ____________________________________________ Ihor Austin. Benjamin Stain, M.D., ABFM., CAQSM., AME. Primary Care and Sports Medicine [facility]  Adjunct Professor of Family Medicine  [place] [employer] [facility] of Medicine  Restaurant manager, fast food

## 2023-11-09 ENCOUNTER — Ambulatory Visit: Payer: Self-pay | Admitting: Physician Assistant

## 2023-11-09 ENCOUNTER — Encounter

## 2023-11-09 ENCOUNTER — Encounter: Payer: Self-pay | Admitting: Physician Assistant

## 2023-11-09 VITALS — HR 62 | Resp 20 | Wt 126.0 lb

## 2023-11-09 DIAGNOSIS — R413 Other amnesia: Secondary | ICD-10-CM | POA: Diagnosis not present

## 2023-11-09 MED ORDER — DONEPEZIL HCL 5 MG PO TABS: 5.0000 mg | ORAL_TABLET | Freq: Every day | ORAL | 3 refills | Status: DC

## 2023-11-09 NOTE — Progress Notes (Signed)
 Assessment/Plan:     Tracey Marshall is a very pleasant 73 y.o. year old RH female with a history of hypertension, hyperlipidemia, BPPV,  DM 2 diet controlled, situational depression seen today for evaluation of memory loss. MoCA today is 23/30. Recent MRI of the brain unremarkable for age. Etiology unclear. Workup in progress. Patient is able to participate on ADLs and to drive without difficulties     Memory Impairment  Neurocognitive testing to further evaluate cognitive concerns and determine other underlying cause of memory changes, including potential contribution from sleep, anxiety, attention, or depression among others  Start donepezil 5 mg daily, side effects discussed Recommend good control of cardiovascular risk factors.   Continue to control mood as per PCP Folllow up in  6 months    Subjective:    The patient is accompanied by her daughter who supplements the history.    How long did patient have memory difficulties?  For about 1 year, progressive.  Patient reports some difficulty remembering new information, recent conversations, names. LTM is good. likes to do crossword puzzles and word finding. Likes to read [employer] in [place]  repeats oneself?  Endorsed Disoriented when walking into a room?  Patient denies    Leaving objects in unusual places?  denies   Wandering behavior? denies   Any personality changes, or depression, anxiety? Denies  Hallucinations or paranoia? Denies.   Seizures? denies    Any sleep changes?  Sleeps well, denies vivid dreams, REM behavior or sleepwalking   Sleep apnea? Denies.   Any hygiene concerns?  Denies.   Independent of bathing and dressing? Endorsed  Does the patient need help with medications?  Patient is in charge   Who is in charge of the finances? Patient is in charge     Any changes in appetite?   Denies.     Patient have trouble swallowing?  Denies.   Does the patient cook? No  Any headaches?  Denies.   Chronic pain?  Denies.   Ambulates with difficulty? Denies. Likes to use the pedals when watching TV Recent falls or head injuries? Denies.     Vision changes?  Denies any new issues. Has a history of cataracts    Any strokelike symptoms? Denies.   Any tremors? Denies.   Any anosmia? Denies.   Any incontinence of urine? Denies.   Any bowel dysfunction? Denies.      Patient lives alone   History of heavy alcohol intake? Denies.   History of heavy tobacco use? Denies.   Family history of dementia?  Denies   Does patient drive? 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 twice daily   carvedilol (COREG) 3.125 mg, Oral, 2 times daily with meals   cyanocobalamin (VITAMIN B12) 1,000 mcg, Daily   lisinopril (ZESTRIL) 40 mg, Oral, Daily   rosuvastatin (CRESTOR) 40 mg, Oral, Daily   sertraline (ZOLOFT) 100 mg, Oral, Daily     VITALS:   Vitals:   11/09/23 1300  Pulse: 62  Resp: 20  SpO2: 98%  Weight: 126 lb (57.2 kg)      PHYSICAL EXAM   HEENT:  Normocephalic, atraumatic.  The superficial temporal arteries are without ropiness or tenderness. Cardiovascular: Regular rate and rhythm. Lungs: Clear to  auscultation bilaterally. Neck: There are no carotid bruits noted bilaterally.  NEUROLOGICAL:    11/09/2023    1:00 PM  Montreal Cognitive Assessment   Visuospatial/ Executive (0/5) 3  Naming (0/3) 2  Attention: Read list of digits (0/2) 2  Attention: Read list of letters (0/1) 1  Attention: Serial 7 subtraction starting at 100 (0/3) 1  Language: Repeat phrase (0/2) 1  Language : Fluency (0/1) 1  Abstraction (0/2) 0  Delayed Recall (0/5) 5  Orientation (0/6) 6  Total 22  Adjusted Score (based on education) 23        No data to  display           Orientation:  Alert and oriented to person, place and not to time . No aphasia or dysarthria. Fund of knowledge is appropriate. Recent memory impaired, remote memory norma;  Attention and concentration are reduced .  Able to name objects and repeat phrases  Delayed recall 5/5 Cranial nerves: There is good facial symmetry. Extraocular muscles are intact and visual fields are full to confrontational testing. Speech is fluent and clear. No tongue deviation. Hearing is intact to conversational tone.  Tone: Tone is good throughout. Sensation: Sensation is intact to light touch.  Vibration is intact at the bilateral big toe.  Coordination: The patient has no difficulty with RAM's or FNF bilaterally. Normal finger to nose  Motor: Strength is 5/5 in the bilateral upper and lower extremities. There is no pronator drift. There are no fasciculations noted. DTR's: Deep tendon reflexes are 2/4 bilaterally. Gait and Station: The patient is able to ambulate without difficulty. Gait is cautious and narrow. Stride length is normal        Thank you for allowing [place] the opportunity to participate in the care of this nice patient. Please do not hesitate to contact us for any questions or concerns.   Total time spent on today's visit was 60 minutes dedicated to this patient today, preparing to see patient, examining the patient, ordering tests and/or medications and counseling the patient, documenting clinical information in the EHR or other health record, independently interpreting results and communicating results to the patient/family, discussing treatment and goals, answering patient's questions and coordinating care.  Cc:  Monica Becton, MD  Marlowe Kays 11/09/2023 2:11 PM

## 2023-11-09 NOTE — Patient Instructions (Addendum)
 It was a pleasure to see you today at our office.   Recommendations:  Neurocognitive evaluation at our office   We will start donepezil  5 mg daily.    Follow up in 6  months    For psychiatric meds, mood meds: Please have your primary care physician manage these medications.  If you have any severe symptoms of a stroke, or other severe issues such as confusion,severe chills or fever, etc call 911 or go to the ER as you may need to be evaluated further   For guidance regarding [facility] and if placement were needed at the facility, contact Social Worker tel: [phone]  For assessment of decision of mental capacity and competency:  Call Dr. Erick Blinks, geriatric psychiatrist at [phone]  Counseling regarding caregiver distress, including caregiver depression, anxiety and issues regarding community resources, adult day care programs, adult living facilities, or memory care questions:  please contact your  Primary Doctor's Social Worker   Whom to call: Memory  decline, memory medications: Call our office [phone]    [email or web address]   RECOMMENDATIONS FOR ALL PATIENTS WITH MEMORY PROBLEMS: 1. Continue to exercise (Recommend 30 minutes of walking everyday, or 3 hours every week) 2. Increase social interactions - continue going to [place] and enjoy social gatherings with friends and family 3. Eat healthy, avoid fried foods and eat more fruits and vegetables 4. Maintain adequate blood pressure, blood sugar, and blood cholesterol level. Reducing the risk of stroke and cardiovascular disease also helps promoting better memory. 5. Avoid stressful situations. Live a simple life and avoid aggravations. Organize your time and prepare for the next day in anticipation. 6. Sleep well, avoid any interruptions of sleep and avoid any distractions in the bedroom that may interfere with adequate sleep quality 7.  Avoid sugar, avoid sweets as there is a strong link between excessive sugar intake, diabetes, and cognitive impairment We discussed the Mediterranean diet, which has been shown to help patients reduce the risk of progressive memory disorders and reduces cardiovascular risk. This includes eating fish, eat fruits and green leafy vegetables, nuts like almonds and hazelnuts, walnuts, and also use olive oil. Avoid fast foods and fried foods as much as possible. Avoid sweets and sugar as sugar use has been linked to worsening of memory function.  There is always a concern of gradual progression of memory problems. If this is the case, then we may need to adjust level of care according to patient needs. Support, both to the patient and caregiver, should then be put into place.      You have been referred for a neuropsychological evaluation (i.e., evaluation of memory and thinking abilities). Please bring someone with you to this appointment if possible, as it is helpful for the doctor to hear from both you and another adult who knows you well. Please bring eyeglasses and hearing aids if you wear them.    The evaluation will take approximately 3 hours and has two parts:   The first part is a clinical interview with the neuropsychologist (Dr. Milbert Coulter or Dr. Robbie Lis). During the interview, the neuropsychologist will speak with you and the individual you brought to the appointment.    The second part of the evaluation is testing with the doctor's technician Annabelle Harman or Selena Batten). During the testing, the technician will ask you to remember different types of material, solve problems, and answer some questionnaires. Your family member will not be present for this portion of the evaluation.   Please  note: We must reserve several hours of the neuropsychologist's time and the psychometrician's time for your evaluation appointment. As such, there is a No-Show fee of $100. If you are unable to attend any of your appointments,  please contact our office as soon as possible to reschedule.      DRIVING: Regarding driving, in patients with progressive memory problems, driving will be impaired. We advise to have someone else do the driving if trouble finding directions or if minor accidents are reported. Independent driving assessment is available to determine safety of driving.   If you are interested in the driving assessment, you can contact the following:  [facility] in [place] [phone]  [facility] [phone]  [facility] [phone]  [facility] [phone] or [phone]   FALL PRECAUTIONS: Be cautious when walking. Scan the area for obstacles that may increase the risk of trips and falls. When getting up in the mornings, sit up at the edge of the bed for a few minutes before getting out of bed. Consider elevating the bed at the head end to avoid drop of blood pressure when getting up. Walk always in a well-lit room (use night lights in the walls). Avoid area rugs or power cords from appliances in the middle of the walkways. Use a walker or a cane if necessary and consider physical therapy for balance exercise. Get your eyesight checked regularly.  FINANCIAL OVERSIGHT: Supervision, especially oversight when making financial decisions or transactions is also recommended.  HOME SAFETY: Consider the safety of the kitchen when operating appliances like stoves, microwave oven, and blender. Consider having supervision and share cooking responsibilities until no longer able to participate in those. Accidents with firearms and other hazards in the house should be identified and addressed as well.   ABILITY TO BE LEFT ALONE: If patient is unable to contact 911 operator, consider using LifeLine, or when the need is there, arrange for someone to stay with patients. Smoking is a fire hazard, consider supervision or cessation. Risk of wandering should be assessed  by caregiver and if detected at any point, supervision and safe proof recommendations should be instituted.  MEDICATION SUPERVISION: Inability to self-administer medication needs to be constantly addressed. Implement a mechanism to ensure safe administration of the medications.      Mediterranean Diet A Mediterranean diet refers to food and lifestyle choices that are based on the traditions of countries located on the [place]. This way of eating has been shown to help prevent certain conditions and improve outcomes for people who have chronic diseases, like kidney disease and heart disease. What are tips for following this plan? Lifestyle  Cook and eat meals together with your family, when possible. Drink enough fluid to keep your urine clear or pale yellow. Be physically active every day. This includes: Aerobic exercise like running or swimming. Leisure activities like gardening, walking, or housework. Get 7-8 hours of sleep each night. If recommended by your health care provider, drink red wine in moderation. This means 1 glass a day for nonpregnant women and 2 glasses a day for men. A glass of wine equals 5 oz (150 mL). Reading food labels  Check the serving size of packaged foods. For foods such as rice and pasta, the serving size refers to the amount of cooked product, not dry. Check the total fat in packaged foods. Avoid foods that have saturated fat or trans fats. Check the ingredients list for added sugars, such as corn syrup. Shopping  At the grocery store, buy most of your food from the areas near the walls of the store. This includes: Fresh fruits and vegetables (produce). Grains, beans, nuts, and seeds. Some of these may be available in unpackaged forms or large amounts (in bulk). Fresh seafood. Poultry and eggs. Low-fat dairy products. Buy whole ingredients instead of prepackaged foods. Buy fresh fruits and vegetables in-season from local farmers markets. Buy  frozen fruits and vegetables in resealable bags. If you do not have access to quality fresh seafood, buy precooked frozen shrimp or canned fish, such as tuna, salmon, or sardines. Buy small amounts of raw or cooked vegetables, salads, or olives from the deli or salad bar at your store. Stock your pantry so you always have certain foods on hand, such as olive oil, canned tuna, canned tomatoes, rice, pasta, and beans. Cooking  Cook foods with extra-virgin olive oil instead of using butter or other vegetable oils. Have meat as a side dish, and have vegetables or grains as your main dish. This means having meat in small portions or adding small amounts of meat to foods like pasta or stew. Use beans or vegetables instead of meat in common dishes like chili or lasagna. Experiment with different cooking methods. Try roasting or broiling vegetables instead of steaming or sauteing them. Add frozen vegetables to soups, stews, pasta, or rice. Add nuts or seeds for added healthy fat at each meal. You can add these to yogurt, salads, or vegetable dishes. Marinate fish or vegetables using olive oil, lemon juice, garlic, and fresh herbs. Meal planning  Plan to eat 1 vegetarian meal one day each week. Try to work up to 2 vegetarian meals, if possible. Eat seafood 2 or more times a week. Have healthy snacks readily available, such as: Vegetable sticks with hummus. Greek yogurt. Fruit and nut trail mix. Eat balanced meals throughout the week. This includes: Fruit: 2-3 servings a day Vegetables: 4-5 servings a day Low-fat dairy: 2 servings a day Fish, poultry, or lean meat: 1 serving a day Beans and legumes: 2 or more servings a week Nuts and seeds: 1-2 servings a day Whole grains: 6-8 servings a day Extra-virgin olive oil: 3-4 servings a day Limit red meat and sweets to only a few servings a month What are my food choices? Mediterranean diet Recommended Grains: Whole-grain pasta. Brown rice. Bulgar  wheat. Polenta. Couscous. Whole-wheat bread. Orpah Cobb. Vegetables: Artichokes. Beets. Broccoli. Cabbage. Carrots. Eggplant. Green beans. Chard. Kale. Spinach. Onions. Leeks. Peas. Squash. Tomatoes. Peppers. Radishes. Fruits: Apples. Apricots. Avocado. Berries. Bananas. Cherries. Dates. Figs. Grapes. Lemons. Melon. Oranges. Peaches. Plums. Pomegranate. Meats and other protein foods: Beans. Almonds. Sunflower seeds. Pine nuts. Peanuts. Cod. Salmon. Scallops. Shrimp. Tuna. Tilapia. Clams. Oysters. Eggs. Dairy: Low-fat milk. Cheese. Greek yogurt. Beverages: Water. Red wine. Herbal tea. Fats and oils: Extra virgin olive oil. Avocado oil. Grape seed oil. Sweets and desserts: [place] yogurt with honey. Baked apples. Poached pears. Trail mix. Seasoning and other foods: Basil. Cilantro. Coriander. Cumin. Mint. Parsley. Sage. Rosemary. Tarragon. Garlic. Oregano. Thyme. Pepper. Balsalmic vinegar. Tahini. Hummus. Tomato sauce. Olives. Mushrooms. Limit these Grains: Prepackaged pasta or rice dishes. Prepackaged cereal with added sugar. Vegetables: Deep fried potatoes (french fries). Fruits: Fruit canned in syrup. Meats and other protein foods: Beef. Pork. Lamb. Poultry with skin. Hot dogs. Tomasa Blase. Dairy: Ice cream. Sour cream. Whole milk. Beverages: Juice. Sugar-sweetened soft drinks. Beer. Liquor and spirits. Fats and oils: Butter. Canola oil. Vegetable oil. Beef fat (tallow). Lard. Sweets and desserts: Cookies.  Cakes. Pies. Candy. Seasoning and other foods: Mayonnaise. Premade sauces and marinades. The items listed may not be a complete list. Talk with your dietitian about what dietary choices are right for you. Summary The Mediterranean diet includes both food and lifestyle choices. Eat a variety of fresh fruits and vegetables, beans, nuts, seeds, and whole grains. Limit the amount of red meat and sweets that you eat. Talk with your health care provider about whether it is safe for you to drink red  wine in moderation. This means 1 glass a day for nonpregnant women and 2 glasses a day for men. A glass of wine equals 5 oz (150 mL). This information is not intended to replace advice given to you by your health care provider. Make sure you discuss any questions you have with your health care provider. Document Released: 03/09/2016 Document Revised: 04/11/2016 Document Reviewed: 03/09/2016 Elsevier Interactive Patient Education  2017 [employer].

## 2023-12-25 ENCOUNTER — Ambulatory Visit: Admitting: Psychology

## 2023-12-25 ENCOUNTER — Ambulatory Visit: Payer: Self-pay

## 2023-12-25 DIAGNOSIS — R4189 Other symptoms and signs involving cognitive functions and awareness: Secondary | ICD-10-CM

## 2023-12-25 NOTE — Progress Notes (Signed)
 NEUROPSYCHOLOGICAL EVALUATION [place]. [facility]  [place] Department of Neurology  Date of Evaluation: 12/25/2023  REASON FOR REFERRAL   Tracey Marshall is a 73 year old, ambidextrous (e.g., writes with right hand, throws a ball with left hand), White female with 11 years of formal education. She was referred for neuropsychological evaluation by Tracey Filbert, PA-C, to assess current neurocognitive functioning, document potential cognitive deficits, and assist with treatment planning. This is her first neuropsychological evaluation.  SUMMARY OF RESULTS   Premorbid cognitive abilities are estimated to be in the average range based on word reading and sociodemographic factors. Given this estimate, performance today was below expectation on only a few tasks, including confrontation naming, verbal abstract reasoning, and bilateral fine motor dexterity.   Regarding the naming task, it appeared that she may have been unfamiliar with some of the words. Further, she did not benefit significantly from phonemic cueing. As for fine motor dexterity, reduced scores were likely attributable to carpal tunnel syndrome.  All other measures of attention/working memory, processing speed, executive functioning, language, visuospatial functioning, and learning/memory were within age-related expectations. On self-report questionnaires, she did not endorse clinically-elevated levels of depression, anxiety, or daytime sleepiness.  DIAGNOSTIC IMPRESSION   Results of the current evaluation were broadly within expectations, except for low confrontation naming and verbal abstract reasoning. Functional independence is maintained. At this time, there is not enough impairment to warrant a diagnosis of mild cognitive impairment. It is unclear whether the low confrontation naming score is solely related to educational factors or to an underlying cognitive issue; therefore, continued monitoring is recommended. Should  she begin to exhibit language or memory deficits, neuropsychological re-evaluation would be warranted. Otherwise, subjective cognitive concerns are likely attributable to hearing loss, mild cerebrovascular pathology, intermittent mood symptoms, and intermittent sleep disturbance. These results serve as a baseline for future comparison, should re-evaluation of the patient become necessary.  ICD-10 Codes: R41.89 Cognitive changes  RECOMMENDATIONS   In consultation with your doctor, schedule cognitive reevaluation on an as-needed basis to assess for cognitive decline and update treatment recommendations. Reevaluation should occur during a period of medical and affective stability.  Continue managing vascular risk factors through a heart healthy diet (e.g., MIND, Mediterranean), physician-approved physical activity, and medication adherence.   Continue to stay socially and mentally engaged. Maintaining strong social connections and regularly stimulating your brain can help protect against cognitive decline. This includes staying connected with friends and family, volunteering, or participating in community groups. Mentally engaging activities--such as reading, doing puzzles, playing strategy games, or learning a new language or musical instrument--promote brain plasticity. If you are interested in activities to support cognitive engagement, this site offers a variety of apps and games organized by difficulty level:   https://www.[facility].org/get-to-know-barrow/centers-programs/neurorehabilitation-center/neuro-rehab-apps-and-games/  Consider implementing compensatory strategies to maximize independence and maintain daily functioning. Examples include:   -Adhere to routine. Compensatory strategies work best when they are used consistently. Use a planner, calendar, or white board that has the schedule and important events for the day clearly listed to reference and cross off when tasks are complete.    -Ask for written information, especially if it is new or unfamiliar (e.g., information provided at a doctor's appointment).   -Create an organized environment. Keep items that can be easily misplaced in a sensible location and get into the habit of always returning the items to those places.   -Pay attention and reduce distractions. Make a point of focusing attention on information you want to remember. One-on-one interaction is more  likely to facilitate attention and minimize distraction. Make eye contact and repeat the information out loud after you hear it. Reduce interruptions or distractions especially when attempting to learn new information.   -Create associations. When learning something new, think about and understand the information. Explain it in your own words or try to associate it with something you already know. Take notes to help remember important details.  -Evaluate goals and plan accordingly. When confronted by many different tasks, begin by making a list that prioritizes each task and estimates the time it will take to complete. Break down complicated tasks into smaller, more manageable steps.   -Focus on one task at a time and complete each task before starting another. Avoid multitasking.  DISPOSITION   Patient will follow up with the referring provider, Tracey Marshall. No follow-up neuropsychological testing was scheduled at this time. Please feel free to refer the patient for repeated evaluation if she shows a significant change in neurocognitive status. She and her daughter will be provided verbal feedback in approximately one week regarding the findings and impression during this visit.  The remainder of the report includes the details of the patient's background and a table of results from the current evaluation, which support the summary and recommendations described above.  BACKGROUND   History of Presenting Illness: The following information was obtained from a  review of medical records and an interview with the patient and her daughter, Tracey Marshall. Patient establish care with neurology on 11/09/2023 due to progressive short-term memory concerns over the past year. MoCA = 23/30. She was referred for neuropsychological evaluation accordingly.  Cognitive Functioning: During today's appointment, both the patient and her daughter denied major cognitive concerns. Patient endorsed some short-term memory (e.g., taking longer to remember information but eventually recalling it) and word-finding difficulties over the past year. She otherwise denied problems with attention, navigation, and executive functioning. Her daughter feels that there has not been significant change and that some of the difficulties appear consistent with normal aging.  Physical Functioning: Patient reported generally sleeping well, though occasionally she has difficulty returning to sleep if she has something on her mind or wakes up to use the restroom. Appetite is stable, although she has been intentionally eating less to lose weight. Since COVID infection, she has noticed some changes in her sense of taste, but her sense of smell remains stable. She has hearing loss and uses hearing aids in both ears. Her vision is largely stable, though she is in the early stages of cataracts. She reported some balance difficulties and has experienced occasional falls in the past, typically occurring when climbing or carrying a handful of groceries. She denied any tremors.  Emotional Functioning: Patient described her recent mood as "generally good." She noted that she worries a little more than usual but denied experiencing significant anxiety or depression. She also denied suicidal ideation. She spends much of her time reading, watching television, doing yard work and other chores, walking the dog, and playing games on her phone.  Imaging: MRI of the brain (10/01/2023) documented mild chronic small vessel ischemic  disease and mild generalized cerebral atrophy.  Other Relevant Medical History: Remarkable for hypertension, hyperlipidemia, type 2 diabetes, coronary artery disease s/p quadruple bypass, diabetic nephropathy, chronic kidney disease stage III, fatty liver disease, emphysema lung, benign positional vertigo, carpal tunnel syndrome, degenerative disc disease, osteoporosis, plantar fasciitis, and h/o breast cancer. No history of stroke, CNS infection, head injury, or seizure was reported.  Current Medications: Per patient, carvedilol ,  donepezil , lisinopril , rosuvastatin , and sertraline . She is also taking vitamin B12 and calcium -phosphorus -vitamin D .  Functional Status: Patient independently performs all basic and instrumental activities (i.e., driving, finances, medication) of daily living without reported difficulty.  Family Neurological History: Unremarkable.  Psychiatric History: History of depression, anxiety, counseling, suicidal ideation, hallucinations, and psychiatric hospitalizations was not reported. She was previously prescribed sertraline  for situational mood symptoms and continues to take it at this time.  Substance Use History: Patient denied current use of alcohol, nicotine, marijuana, and illicit substances. She quit smoking cigarettes approximately 15 years ago.  Social and Developmental History: Patient was born in [place], [place]. History of perinatal complications and developmental delays was not reported. She is widowed and lives alone. She has four children.  Educational and Occupational History: No history of childhood learning disability, special education services, or grade retention was reported. 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 She completed the 11th grade and later earned a GED. 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 She is  retired.  BEHAVIORAL OBSERVATIONS   Patient arrived on time and was accompanied by her daughter, Tracey Marshall. She ambulated independently and without gait disturbance. She was alert and fully oriented. She was appropriately groomed and dressed for the setting. No significant sensory or motor abnormalities were observed. Vision (with glasses) and hearing (with hearing aids) were adequate for testing purposes. Speech was of normal rate, prosody, and volume. No conversational word-finding difficulties, paraphasic errors, or dysarthria were observed. Comprehension was conversationally intact. Thought processes were linear, logical, and coherent. Thought content was organized and devoid of delusions. Insight appeared appropriate. Affect was even and congruent with euthymic mood. She was cooperative and gave adequate effort during testing, including on standalone and embedded measures of performance validity. Results are thought to accurately reflect her cognitive functioning at this time.  NEUROPSYCHOLOGICAL TESTING RESULTS   Tests Administered: Animal Naming Test; Brief Visuospatial Memory Test-Revised (BVMT-R) - Form 1; Controlled Oral Word Association Test (COWAT): FAS; Delis-Kaplan Executive Function System (D-KEFS) - Subtest(s): Color-Word Interference Test; Epworth Sleepiness Scale (ESS); Geriatric Anxiety Scale-10 Item (GAS-10); Geriatric Depression Scale Short Form (GDS-SF); Grooved Pegboard Test; [employer] Verbal Learning Test Revised (HVLT-R) - From 1; Judgment of Line Orientation (JLO) - Form H; Neuropsychological Assessment Battery (NAB) - Subtest(s): Naming Form 1; Standalone performance validity test (PVT); Test of Premorbid Functioning (TOPF); Trail Making Test (TMT); Wechsler Adult Intelligence Scale Fifth Edition (WAIS-5) - Subtest(s): Similarities, Clinical cytogeneticist, Matrix Reasoning, Digit Sequencing, Coding, Running Digits, Symbol Search, Symbol Span; and Wechsler Memory Scale Fourth Edition (WMS-IV) -  Subtest(s): Logical Memory (LM).  Test results are provided in the table below. Whenever possible, the patient's scores were compared against age-, sex-, and education-corrected normative samples. Interpretive descriptions are based on the AACN consensus conference statement on uniform labeling (Guilmette et al., 2020).  PREMORBID FUNCTIONING RAW  RANGE  TOPF 50 StdS=107 Average  ATTENTION & WORKING MEMORY RAW  RANGE  WAIS-5 Digit Sequencing -- ss=10 Average  WAIS-5 Running Digits -- ss=11 Average  WAIS-5 Symbol Span -- ss=8 Average  PROCESSING SPEED RAW  RANGE  Trails A 52''1e T=44 Average  WAIS-5 Coding  -- ss=6 Low Average  WAIS-5 Symbol Search -- ss=8 Average  DKEFS CWIT Color Naming 30''0e ss=11 Average  DKEFS CWIT Word Reading 19''0e ss=13 High Average  EXECUTIVE FUNCTION RAW  RANGE  Trails B 94''0e T=53 Average  WAIS-5 Matrix Reasoning --  ss=6 Low Average  WAIS-5 Similarities -- ss=4 Below Average  COWAT Letter Fluency 12+7+13 T=45 Average  DKEFS CWIT Inhibition 79''4e ss=9 Average  DKEFS CWIT Inhibition/Switching 86''2e ss=9 Average  LANGUAGE RAW  RANGE  COWAT Letter Fluency 12+7+13 T=45 Average  Animal Naming Test 14 T=43 Average  NAB Naming Test 22/31  +3/9 with PC T=29 BNL  VISUOSPATIAL RAW    WAIS-5 Block Design -- ss=9 Average  JLO C/S=26/30 56%ile Average  BVMT-R Copy Trial 12/12 -- WNL  VERBAL LEARNING & MEMORY RAW  RANGE  HVLT Learning Trials (4+7+7)/36 T=40 Low Average  HVLT Delayed Recall 6/12 T=42 Low Average  HVLT Recognition Hits 11 -- --  HVLT Recognition False Positives 1 -- --  HVLT Discrimination Index 10 T=48 Average  WMS-IV LM-I  (7+10+12)/53 ss=9 Average  WMS-IV LM-II  (8+9)/39 ss=10 Average  WMS-IV LM Recognition  (8+10)/23 26-50%ile Average  VISUAL LEARNING & MEMORY RAW  RANGE  BVMT-R Total Recall (4+8+9)/36 T=51 Average  BVMT-R Delayed Recall 7/12 T=47 Average  BVMT-R Percent Retained 78 >16%ile WNL  BVMT-R Recognition Hits 6 >16%ile WNL   BVMT-R Recognition False Alarms 0 >16%ile WNL  BVMT-R Recognition Discrimination Index 6 >16%ile WNL  FINE MOTOR DEXTERITY RAW  RANGE  Grooved Pegboard (Dominant Hand) 148''0d T=30 Below Average  Grooved Pegboard (Non-Dominant Hand) 140''2d T=34 Below Average  QUESTIONNAIRES RAW  RANGE  GDS-SF 0 -- Minimal  GAS-10 3 -- Minimal  ESS 1 -- WNL  *Note: ss = scaled score; StdS = standard score; T = t-score; C/S = corrected raw score; WNL = within normal limits; BNL= below normal limits; D/C = discontinued. Scores from skewed distributions are typically interpreted as WNL (>=16th %ile) or BNL (<16th %ile).   INFORMED CONSENT   Patient was provided with a verbal description of the nature and purpose of the neuropsychological evaluation. Also reviewed were the foreseeable risks and/or discomforts and benefits of the procedure, limits of confidentiality, and mandatory reporting requirements of this provider. Patient was given the opportunity to have their questions answered. Oral consent to participate was provided by the patient.   This report was prepared as part of a clinical evaluation and is not intended for forensic use.  SERVICE   This evaluation was conducted by Janice Meeter, Psy.D. In addition to time spent directly with the patient, total professional time (120 minutes) includes record review, integration of relevant medical history, test selection, interpretation of findings, and report preparation. A technician, Arnulfo Larch, B.S., provided testing and scoring assistance for 130 minutes.  Psychiatric Diagnostic Evaluation Services (Professional): 16109 x 1 Neuropsychological Testing Evaluation Services (Professional): 60454 x 1 Neuropsychological Testing Evaluation Services (Professional): 09811 x 1 Neuropsychological Test Administration and Scoring Radiographer, therapeutic): [phone] x 1 Neuropsychological Test Administration and Scoring (Technician): [phone] x 3  This report was generated using  voice recognition software. While this document has been carefully reviewed, transcription errors may be present. I apologize in advance for any inconvenience. Please contact me if further clarification is needed.            Janice Meeter, Psy.D.             Neuropsychologist

## 2023-12-25 NOTE — Progress Notes (Signed)
   Psychometrician Note   Cognitive testing was administered to Kittie Perking by Arnulfo Larch, B.S. (psychometrist) under the supervision of Dr. Janice Meeter, Psy.D., licensed psychologist on 12/25/2023. Ms. Harris did not appear overtly distressed by the testing session per behavioral observation or responses across self-report questionnaires. Rest breaks were offered.    The battery of tests administered was selected by Dr. Janice Meeter, Psy.D. with consideration to Ms. Wachter's current level of functioning, the nature of her symptoms, emotional and behavioral responses during interview, level of literacy, observed level of motivation/effort, and the nature of the referral question. This battery was communicated to the psychometrist. Communication between Dr. Janice Meeter, Psy.D. and the psychometrist was ongoing throughout the evaluation and Dr. Janice Meeter, Psy.D. was immediately accessible at all times. Dr. Janice Meeter, Psy.D. provided supervision to the psychometrist on the date of this service to the extent necessary to assure the quality of all services provided.    Stacie Templin will return within approximately 1-2 weeks for an interactive feedback session with Dr. Donavon Fudge at which time her test performances, clinical impressions, and treatment recommendations will be reviewed in detail. Ms. Neuser understands she can contact our office should she require our assistance before this time.  A total of 130 minutes of billable time were spent face-to-face with Ms. Whirley by the psychometrist. This includes both test administration and scoring time. Billing for these services is reflected in the clinical report generated by Dr. Janice Meeter, Psy.D.  This note reflects time spent with the psychometrician and does not include test scores or any clinical interpretations made by Dr. Donavon Fudge. The full report will follow in a separate note.

## 2024-01-01 ENCOUNTER — Ambulatory Visit: Admitting: Psychology

## 2024-01-01 DIAGNOSIS — R4189 Other symptoms and signs involving cognitive functions and awareness: Secondary | ICD-10-CM | POA: Diagnosis not present

## 2024-01-01 NOTE — Progress Notes (Signed)
   NEUROPSYCHOLOGY FEEDBACK SESSION [place]. [facility]  [place] Department of Neurology  Date of Feedback Session: 01/01/2024  REASON FOR REFERRAL   Tracey Marshall is a 73 year old, ambidextrous (e.g., writes with right hand, throws a ball with left hand), White female with 11 years of formal education. She was referred for neuropsychological evaluation by Tex Filbert, PA-C, to assess current neurocognitive functioning, document potential cognitive deficits, and assist with treatment planning. This is her first neuropsychological evaluation.  FEEDBACK   Patient completed a comprehensive neuropsychological evaluation on 12/25/2023. Please refer to that encounter for the full report and recommendations. Briefly, results were broadly within expectations, except for low confrontation naming and verbal abstract reasoning. Functional independence is maintained. At this time, there is not enough impairment to warrant a diagnosis of mild cognitive impairment. It is unclear whether the low confrontation naming score is solely related to educational factors or to an underlying cognitive issue; therefore, continued monitoring is recommended. Should she begin to exhibit language or memory deficits, neuropsychological re-evaluation would be warranted. Otherwise, subjective cognitive concerns are likely attributable to hearing loss, mild cerebrovascular pathology, intermittent mood symptoms, and intermittent sleep disturbance.    Today, the patient was accompanied by her daughter. They were provided verbal feedback regarding the findings and impression during this visit, and their questions were answered. A copy of the report was provided at the conclusion of the visit.  DISPOSITION   Patient will follow up with the referring provider, [name]. No follow-up neuropsychological testing was scheduled at this time. Please feel free to refer the patient for repeated evaluation if she shows a  significant change in neurocognitive status.  SERVICE   This feedback session was conducted by Janice Meeter, Psy.D. One unit of 16[address] (35 minutes) was billed for Dr. Donavon Fudge' time spent in preparing, conducting, and documenting the current feedback session.  This report was generated using voice recognition software. While this document has been carefully reviewed, transcription errors may be present. I apologize in advance for any inconvenience. Please contact me if further clarification is needed.

## 2024-01-09 DIAGNOSIS — E119 Type 2 diabetes mellitus without complications: Secondary | ICD-10-CM | POA: Diagnosis not present

## 2024-01-10 NOTE — Progress Notes (Signed)
 Assessment/Plan:    Cognitive Changes Tracey Marshall is a very pleasant 73 y.o. RH female with a history ofhypertension, hyperlipidemia, BPPV, DM 2 diet controlled, situational depression and a diagnosis of cognitive changes  at this time, there is not enough impairment to warrant a diagnosis of mild cognitive impairment, subjective cognitive concerns are likely attributable to hearing loss, mild cerebrovascular pathology, intermittent mood symptoms, and intermittent sleep disturbance. She is presenting resenting today in follow-up .Patient is on donepezil  5 mg daily, tolerating well.***.     Recommendations:   Follow up in 1 year Continue donepezil  5 mg daily, side effects discussed Recommend good control of cardiovascular risk factors Continue to control mood as per PCP Recommend checking hearing in an effort to improve comprehension    Subjective:   This patient is accompanied in the office by her daughter  who supplements the history. Previous records as well as any outside records available were reviewed prior to todays visit.   Patient was last seen on 11/09/2023 with [facility] 23/30. Tracey Marshall    Any changes in memory since last visit?  May be better I may retrieve the words better.  LTM is good.  Likes to do crossword puzzles and word finding.  Likes to read mystery stories in [place].. repeats oneself?  Endorsed Disoriented when walking into a room?  Patient denies ***  Misplacing objects?  Patient denies   Wandering behavior?   Denies. Any personality changes since last visit? Denies.   Any worsening depression?: denies.   Hallucinations or paranoia?  Denies.   Seizures?   Denies.    Any sleep changes? Sleeps fairly well. Denies vivid dreams, REM behavior or sleepwalking   Sleep apnea?   denies    Any hygiene concerns?   Denies.   Independent of bathing and dressing?  Endorsed  Does the patient needs help with medications? Patient is in charge   Who is in charge of the finances?   Patient is in charge     Any changes in appetite?  Denies.     Patient have trouble swallowing?  Denies.   Does the patient cook?  No   Any headaches?    Denies.   Vision changes? Denies. Chronic pain?  Denies.   Ambulates with difficulty?    Denies. Use the pedals when watching TV.   Recent falls or head injuries?    Denies.      Unilateral weakness, numbness or tingling?  Denies.   Any tremors?  Denies.   Any anosmia?    Denies.   Any incontinence of urine?  Denies.   Any bowel dysfunction?  Denies.      Patient lives alone. Does the patient drive?  Yes, denies becoming lost   Initial visit 4.11.25 How long did patient have memory difficulties?  For about 1 year, progressive.  Patient reports some difficulty remembering new information, recent conversations, names. LTM is good. likes to do crossword puzzles and word finding. Likes to read [employer] in [place]  repeats oneself?  Endorsed Disoriented when walking into a room?  Patient denies    Leaving objects in unusual places?  denies   Wandering behavior? denies   Any personality changes, or depression, anxiety? Denies  Hallucinations or paranoia? Denies.   Seizures? denies    Any sleep changes?  Sleeps well, denies vivid dreams, REM behavior or sleepwalking   Sleep apnea? Denies.   Any hygiene concerns?  Denies.   Independent of bathing and dressing? Endorsed  Does the patient need help with medications?  Patient is in charge   Who is in charge of the finances? Patient is in charge     Any changes in appetite?   Denies.     Patient have trouble swallowing?  Denies.   Does the patient cook? No  Any headaches?  Denies.   Chronic pain? Denies.   Ambulates with difficulty? Denies. Likes to use the pedals when watching TV Recent falls or head injuries? Denies.     Vision changes?  Denies any new issues. Has a history of cataracts    Any strokelike symptoms? Denies.   Any tremors? Denies.   Any anosmia? Denies.   Any  incontinence of urine? Denies.   Any bowel dysfunction? Denies.      Patient lives alone   History of heavy alcohol intake? Denies.   History of heavy tobacco use? Denies.   Family history of dementia?  Denies   Does patient drive? 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  Neuropsych evaluation, 12/25/2023 Dr.Kdeiss Briefly, results were broadly within expectations, except for low confrontation naming and verbal abstract reasoning. Functional independence is maintained. At this time, there is not enough impairment to warrant a diagnosis of mild cognitive impairment. It is unclear whether the low confrontation naming score is solely related to educational factors or to an underlying cognitive issue; therefore, continued monitoring is recommended. Should she begin to exhibit language or memory deficits, neuropsychological re-evaluation would be warranted. Otherwise, subjective cognitive concerns are likely attributable to hearing loss, mild cerebrovascular pathology, intermittent mood symptoms, and intermittent sleep disturbance.    Past Medical History:  Diagnosis Date   Allergy    Annual physical exam 06/10/2015   Annual physical exam 06/10/2015   Arthritis    CHF (congestive heart failure) (HCC)    CKD (chronic kidney disease) stage 3, GFR 30-59 ml/min (HCC) 12/14/2021   Emphysema lung (HCC) 12/12/2019   Emphysema of lung (HCC)    Hyperlipidemia    Hypertension      Past Surgical History:  Procedure Laterality Date   BREAST SURGERY     CESAREAN SECTION     CORONARY ARTERY BYPASS GRAFT       PREVIOUS MEDICATIONS:   CURRENT MEDICATIONS:   Outpatient Encounter Medications as of 01/11/2024  Medication Sig   Calcium -Phosphorus -Vitamin D  250-100-500 MG-MG-UNIT CHEW 1 tablet p.o. twice daily   carvedilol  (COREG ) 3.125 MG tablet Take 1 tablet (3.125 mg total) by mouth 2 (two) times daily with a meal.   cyanocobalamin  (VITAMIN B12) 1000 MCG tablet Take 1,000 mcg by mouth daily.   donepezil  (ARICEPT ) 5 MG tablet Take 1 tablet (5 mg total) by mouth daily.   lisinopril  (ZESTRIL ) 40 MG tablet Take 1 tablet (40 mg total) by mouth daily.   rosuvastatin  (CRESTOR ) 40 MG tablet Take 1 tablet (40 mg total) by mouth daily.   sertraline  (ZOLOFT ) 100 MG tablet Take 1 tablet (100 mg total) by mouth daily.   No facility-administered encounter medications on file as of 01/11/2024.     Objective:     PHYSICAL EXAMINATION:    VITALS:  There were no vitals filed for this visit.  GEN:  The patient appears  stated age and is in NAD. HEENT:  Normocephalic, atraumatic.   Neurological examination:  General: NAD, well-groomed, appears stated age. Orientation: The patient is alert. Oriented to person, place and not to date.*** Cranial nerves: There is good facial symmetry.The speech is fluent and clear. No aphasia or dysarthria. Fund of knowledge is appropriate. Recent memory impaired and remote memory is normal.  Attention and concentration are normal.  Able to name objects and repeat phrases.  Hearing is intact to conversational tone ***.   Delayed recall *** Sensation: Sensation is intact to light touch throughout Motor: Strength is at least antigravity x4. DTR's 2/4 in UE/LE      11/09/2023    1:00 PM  Montreal Cognitive Assessment   Visuospatial/ Executive (0/5) 3  Naming (0/3) 2  Attention: Read list of digits (0/2) 2  Attention: Read list of letters (0/1) 1  Attention: Serial 7 subtraction starting at 100 (0/3) 1  Language: Repeat phrase (0/2) 1  Language : Fluency (0/1) 1  Abstraction (0/2) 0  Delayed Recall (0/5) 5  Orientation  (0/6) 6  Total 22  Adjusted Score (based on education) 23        No data to display             Movement examination: Tone: There is normal tone in the UE/LE Abnormal movements:  no tremor.  No myoclonus.  No asterixis.   Coordination:  There is no decremation with RAM's. Normal finger to nose  Gait and Station: The patient has no difficulty arising out of a deep-seated chair without the use of the hands. The patient's stride length is good.  Gait is cautious and narrow.   Thank you for allowing us  the opportunity to participate in the care of this nice patient. Please do not hesitate to contact us  for any questions or concerns.   Total time spent on today's visit was *** minutes dedicated to this patient today, preparing to see patient, examining the patient, ordering tests and/or medications and counseling the patient, documenting clinical information in the EHR or other health record, independently interpreting results and communicating results to the patient/family, discussing treatment and goals, answering patient's questions and coordinating care.  Cc:  Gean Keels, MD  Tex Filbert 01/10/2024 7:33 AM

## 2024-01-11 ENCOUNTER — Encounter: Payer: Self-pay | Admitting: Physician Assistant

## 2024-01-11 ENCOUNTER — Ambulatory Visit: Admitting: Physician Assistant

## 2024-01-11 VITALS — BP 143/64 | HR 68 | Resp 20 | Ht <= 58 in

## 2024-01-11 DIAGNOSIS — R4189 Other symptoms and signs involving cognitive functions and awareness: Secondary | ICD-10-CM | POA: Diagnosis not present

## 2024-01-11 MED ORDER — DONEPEZIL HCL 5 MG PO TABS: 5.0000 mg | ORAL_TABLET | Freq: Every day | ORAL | 3 refills | Status: AC

## 2024-01-24 ENCOUNTER — Ambulatory Visit (INDEPENDENT_AMBULATORY_CARE_PROVIDER_SITE_OTHER): Admitting: Sports Medicine

## 2024-01-24 VITALS — BP 145/72 | HR 56 | Temp 98.1°F | Ht <= 58 in | Wt 127.0 lb

## 2024-01-24 DIAGNOSIS — E538 Deficiency of other specified B group vitamins: Secondary | ICD-10-CM

## 2024-01-24 DIAGNOSIS — B079 Viral wart, unspecified: Secondary | ICD-10-CM | POA: Diagnosis not present

## 2024-01-24 DIAGNOSIS — E1121 Type 2 diabetes mellitus with diabetic nephropathy: Secondary | ICD-10-CM | POA: Diagnosis not present

## 2024-01-24 DIAGNOSIS — L237 Allergic contact dermatitis due to plants, except food: Secondary | ICD-10-CM

## 2024-01-24 DIAGNOSIS — Z0000 Encounter for general adult medical examination without abnormal findings: Secondary | ICD-10-CM | POA: Diagnosis not present

## 2024-01-24 DIAGNOSIS — C50912 Malignant neoplasm of unspecified site of left female breast: Secondary | ICD-10-CM | POA: Diagnosis not present

## 2024-01-24 DIAGNOSIS — E559 Vitamin D deficiency, unspecified: Secondary | ICD-10-CM

## 2024-01-24 DIAGNOSIS — E1169 Type 2 diabetes mellitus with other specified complication: Secondary | ICD-10-CM

## 2024-01-24 DIAGNOSIS — E669 Obesity, unspecified: Secondary | ICD-10-CM

## 2024-01-24 MED ORDER — TRIAMCINOLONE ACETONIDE 0.1 % EX OINT: 1.0000 | TOPICAL_OINTMENT | Freq: Two times a day (BID) | CUTANEOUS | 6 refills | Status: AC

## 2024-01-24 NOTE — Progress Notes (Addendum)
 Subjective:    CC: Annual Physical Exam  HPI:  This patient is here for their annual physical  I reviewed the past medical history, family history, social history, surgical history, and allergies today and no changes were needed.  Please see the problem list section below in epic for further details.  Past Medical History: Past Medical History:  Diagnosis Date   Allergy    Annual physical exam 06/10/2015   Annual physical exam 06/10/2015   Arthritis    CHF (congestive heart failure) (HCC)    CKD (chronic kidney disease) stage 3, GFR 30-59 ml/min (HCC) 12/14/2021   Emphysema lung (HCC) 12/12/2019   Emphysema of lung (HCC)    Hyperlipidemia    Hypertension    Past Surgical History: Past Surgical History:  Procedure Laterality Date   BREAST SURGERY     CESAREAN SECTION     CORONARY ARTERY BYPASS GRAFT     Social History: Social History   Socioeconomic History   Marital status: Widowed    Spouse name: Madaline   Number of children: 4   Years of education: 12   Highest education level: Never attended school  Occupational History   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  Vaping Use   Vaping status: Never Used  Substance and Sexual Activity   Alcohol use: No   Drug use: No   Sexual activity: Not Currently    Birth control/protection: Condom, Pill  Other Topics Concern   Not on file  Social History Narrative   Lives alone. Her husband passed away in 02/16/24. She has four children. She enjoys spending time with her friends.   11th grade got her GED   Lives alone   4 children   No smoking or ETOH   Caffeine  yes   Right hand   Social Drivers of Health   Financial Resource Strain: Low Risk  (01/17/2024)   Overall Financial Resource Strain (CARDIA)    Difficulty of Paying Living Expenses: Not very hard  Food Insecurity: No Food Insecurity (01/17/2024)   Hunger Vital Sign    Worried About Running Out of Food in the Last Year: Never true    Ran Out of Food in the Last Year: Never true  Transportation Needs: No Transportation Needs (01/17/2024)   PRAPARE - Administrator, Civil Service (Medical): No    Lack of Transportation (Non-Medical): No  Physical Activity: Insufficiently Active (01/17/2024)   Exercise Vital Sign    Days of Exercise per Week: 2 days    Minutes of Exercise per Session: 40 min  Stress: No Stress Concern Present (01/17/2024)   [employer] of Occupational Health - Occupational Stress Questionnaire    Feeling of Stress: Only a little  Social Connections: Socially Isolated (01/17/2024)   Social Connection and Isolation Panel    Frequency of Communication with Friends and Family: Twice a week    Frequency of Social Gatherings with Friends and Family: More than three times a week    Attends Religious Services: Never  Active Member of Clubs or Organizations: No    Attends Banker Meetings: Not on file    Marital Status: Widowed   Family History: Family History  Problem Relation Age of Onset   Diabetes Sister    Healthy Mother    Healthy Father    Cancer Neg Hx    Hypertension Neg Hx    Allergies: Allergies  Allergen Reactions   Nickel Rash   Influenza Vaccines    Medications: See med rec.  Review of Systems: No headache, visual changes, nausea, vomiting, diarrhea, constipation, dizziness, abdominal pain, skin rash, fevers, chills, night sweats, swollen lymph nodes, weight loss, chest pain, body aches, joint swelling, muscle aches, shortness of breath, mood changes, visual or auditory hallucinations.  Objective:    General: Well  Developed, well nourished, and in no acute distress.  Neuro: Alert and oriented x3, extra-ocular muscles intact, sensation grossly intact. Cranial nerves II through XII are intact, motor, sensory, and coordinative functions are all intact. HEENT: Normocephalic, atraumatic, pupils equal round reactive to light, neck supple, no masses, no lymphadenopathy, thyroid  nonpalpable. Oropharynx, nasopharynx, external ear canals are unremarkable. Skin: Warm and dry, no rashes noted.  Cardiac: Regular rate and rhythm, no murmurs rubs or gallops.  Respiratory: Clear to auscultation bilaterally. Not using accessory muscles, speaking in full sentences.  Abdominal: Soft, nontender, nondistended, positive bowel sounds, no masses, no organomegaly.  Musculoskeletal: Shoulder, elbow, wrist, hip, knee, ankle stable, and with full range of motion.  Procedure:  Cryodestruction of #3 warts right hand Consent obtained and verified. Time-out conducted. Noted no overlying erythema, induration, or other signs of local infection. Completed without difficulty using Cryo-Gun. Advised to call if fevers/chills, erythema, induration, drainage, or persistent bleeding.  Impression and Recommendations:    The patient was counselled, risk factors were discussed, anticipatory guidance given.  Annual physical exam Annual physical as above. Declines vaccinations.   Diabetic nephropathy associated with type 2 diabetes mellitus (HCC) Ordering updated labs including urine microalbumin creatinine ratio.  There is increased microalbumin creatinine ratio, this indicates diabetic nephropathy, she is currently on an ACE inhibitor, we will minimize nephrotoxic medications, I would like a renal ultrasound, no other intervention needed.  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   Wart of hand Cryotherapy of #3 warts, return in a month to reevaluate.  The 1 on the index finger may need to be pared down before repeat cryotherapy.   ____________________________________________ Tracey Marshall, M.D., ABFM., CAQSM., AME. Primary Care and Sports Medicine [facility]  Adjunct Professor of [facility] Medicine  [employer]  Restaurant manager, fast food

## 2024-01-24 NOTE — Addendum Note (Signed)
 Addended by: CURTIS DEBBY PARAS on: 01/24/2024 09:32 AM   Modules accepted: Orders

## 2024-01-24 NOTE — Assessment & Plan Note (Addendum)
 Ordering updated labs including urine microalbumin creatinine ratio.  There is increased microalbumin creatinine ratio, this indicates diabetic nephropathy, she is currently on an ACE inhibitor, we will minimize nephrotoxic medications, I would like a renal ultrasound, no other intervention needed.

## 2024-01-24 NOTE — Assessment & Plan Note (Signed)
 Annual physical as above. Declines vaccinations.

## 2024-01-24 NOTE — Assessment & Plan Note (Signed)
 Cryotherapy of #3 warts, return in a month to reevaluate.  The 1 on the index finger may need to be pared down before repeat cryotherapy.

## 2024-01-24 NOTE — Assessment & Plan Note (Signed)
 History of breast cancer, she did have a recent mammogram earlier this year that resulted in a negative biopsy per her report, she is managed by [facility] breast surgery.

## 2024-01-25 ENCOUNTER — Ambulatory Visit: Payer: Self-pay | Admitting: Sports Medicine

## 2024-01-25 LAB — CMP14+EGFR
ALT: 27 IU/L (ref 0–32)
AST: 34 IU/L (ref 0–40)
Albumin: 4.2 g/dL (ref 3.8–4.8)
Alkaline Phosphatase: 65 IU/L (ref 44–121)
BUN/Creatinine Ratio: 12 (ref 12–28)
BUN: 12 mg/dL (ref 8–27)
Bilirubin Total: 0.3 mg/dL (ref 0.0–1.2)
CO2: 23 mmol/L (ref 20–29)
Calcium: 9.7 mg/dL (ref 8.7–10.3)
Chloride: 108 mmol/L — ABNORMAL HIGH (ref 96–106)
Creatinine, Ser: 1.03 mg/dL — ABNORMAL HIGH (ref 0.57–1.00)
Globulin, Total: 2.4 g/dL (ref 1.5–4.5)
Glucose: 83 mg/dL (ref 70–99)
Potassium: 4.3 mmol/L (ref 3.5–5.2)
Sodium: 139 mmol/L (ref 134–144)
Total Protein: 6.6 g/dL (ref 6.0–8.5)
eGFR: 57 mL/min/{1.73_m2} — ABNORMAL LOW (ref >59)

## 2024-01-25 LAB — CBC WITH DIFFERENTIAL/PLATELET
Basophils Absolute: 0.1 10*3/uL (ref 0.0–0.2)
Basos: 1 %
EOS (ABSOLUTE): 0.2 10*3/uL (ref 0.0–0.4)
Eos: 3 %
Hematocrit: 39.4 % (ref 34.0–46.6)
Hemoglobin: 12.3 g/dL (ref 11.1–15.9)
Immature Grans (Abs): 0 10*3/uL (ref 0.0–0.1)
Immature Granulocytes: 0 %
Lymphocytes Absolute: 2.1 10*3/uL (ref 0.7–3.1)
Lymphs: 27 %
MCH: 28.3 pg (ref 26.6–33.0)
MCHC: 31.2 g/dL — ABNORMAL LOW (ref 31.5–35.7)
MCV: 91 fL (ref 79–97)
Monocytes Absolute: 0.7 10*3/uL (ref 0.1–0.9)
Monocytes: 9 %
Neutrophils Absolute: 4.8 10*3/uL (ref 1.4–7.0)
Neutrophils: 60 %
Platelets: 253 10*3/uL (ref 150–450)
RBC: 4.34 x10E6/uL (ref 3.77–5.28)
RDW: 13 % (ref 11.7–15.4)
WBC: 7.8 10*3/uL (ref 3.4–10.8)

## 2024-01-25 LAB — LIPID PANEL
Chol/HDL Ratio: 2.1 ratio (ref 0.0–4.4)
Cholesterol, Total: 105 mg/dL (ref 100–199)
HDL: 51 mg/dL (ref >39)
LDL Chol Calc (NIH): 37 mg/dL (ref 0–99)
Triglycerides: 90 mg/dL (ref 0–149)
VLDL Cholesterol Cal: 17 mg/dL (ref 5–40)

## 2024-01-25 LAB — HEMOGLOBIN A1C
Est. average glucose Bld gHb Est-mCnc: 123 mg/dL
Hgb A1c MFr Bld: 5.9 % — ABNORMAL HIGH (ref 4.8–5.6)

## 2024-01-25 LAB — VITAMIN D 25 HYDROXY (VIT D DEFICIENCY, FRACTURES): Vit D, 25-Hydroxy: 94.5 ng/mL (ref 30.0–100.0)

## 2024-01-25 LAB — MICROALBUMIN / CREATININE URINE RATIO
Creatinine, Urine: 82.9 mg/dL
Microalb/Creat Ratio: 101 mg/g{creat} — ABNORMAL HIGH (ref 0–29)
Microalbumin, Urine: 83.8 ug/mL

## 2024-01-25 LAB — VITAMIN B12: Vitamin B-12: >2000 pg/mL — ABNORMAL HIGH (ref 232–1245)

## 2024-01-25 LAB — TSH: TSH: 2.54 u[IU]/mL (ref 0.450–4.500)

## 2024-01-25 NOTE — Addendum Note (Signed)
 Addended by: CURTIS DEBBY PARAS on: 01/25/2024 05:11 PM   Modules accepted: Orders

## 2024-01-30 ENCOUNTER — Other Ambulatory Visit

## 2024-01-30 DIAGNOSIS — E1121 Type 2 diabetes mellitus with diabetic nephropathy: Secondary | ICD-10-CM | POA: Diagnosis not present

## 2024-01-30 DIAGNOSIS — N200 Calculus of kidney: Secondary | ICD-10-CM | POA: Diagnosis not present

## 2024-02-21 ENCOUNTER — Ambulatory Visit (INDEPENDENT_AMBULATORY_CARE_PROVIDER_SITE_OTHER): Admitting: Sports Medicine

## 2024-02-21 ENCOUNTER — Encounter: Payer: Self-pay | Admitting: Sports Medicine

## 2024-02-21 DIAGNOSIS — B079 Viral wart, unspecified: Secondary | ICD-10-CM | POA: Diagnosis not present

## 2024-02-21 DIAGNOSIS — L987 Excessive and redundant skin and subcutaneous tissue: Secondary | ICD-10-CM | POA: Insufficient documentation

## 2024-02-21 NOTE — Assessment & Plan Note (Addendum)
 There was good improvements of the 3 warts on the right hand after the first session of cryotherapy, she still has some remnants. Cryotherapy #2 warts on the right hand was performed today, the one on the index finger may need to be pared down if he does not get sufficient efficacy over the next 2 sessions.

## 2024-02-21 NOTE — Progress Notes (Signed)
    Procedures performed today:    Procedure:  Cryodestruction of #3 warts right hand Consent obtained and verified. Time-out conducted. Noted no overlying erythema, induration, or other signs of local infection. Completed without difficulty using Cryo-Gun. Advised to call if fevers/chills, erythema, induration, drainage, or persistent bleeding.  Independent interpretation of notes and tests performed by another provider:   None.  Brief History, Exam, Impression, and Recommendations:    Wart of hand There was good improvements of the 3 warts on the right hand after the first session of cryotherapy, she still has some remnants. Cryotherapy #2 warts on the right hand was performed today, the one on the index finger may need to be pared down if he does not get sufficient efficacy over the next 2 sessions.  Redundant skin of abdomen Multiparous, redundant abdominal skin, intermittent intertrigo, referral to plastic surgery for discussion of abdominoplasty.    ____________________________________________ Debby PARAS. Curtis, M.D., ABFM., CAQSM., AME. Primary Care and Sports Medicine [facility]  Adjunct Professor of [facility] Medicine  [employer]  [employer]  Restaurant manager, fast food

## 2024-02-21 NOTE — Assessment & Plan Note (Signed)
 Multiparous, redundant abdominal skin, intermittent intertrigo, referral to plastic surgery for discussion of abdominoplasty.

## 2024-02-28 ENCOUNTER — Telehealth: Payer: Self-pay | Admitting: Sports Medicine

## 2024-02-28 NOTE — Telephone Encounter (Signed)
 Pt called in stating that the referral sent in to [facility]; does not accept Medicare. Reccommended sending it to [facility] instead.

## 2024-03-20 ENCOUNTER — Encounter: Payer: Self-pay | Admitting: Sports Medicine

## 2024-03-20 ENCOUNTER — Ambulatory Visit (INDEPENDENT_AMBULATORY_CARE_PROVIDER_SITE_OTHER): Admitting: Sports Medicine

## 2024-03-20 DIAGNOSIS — B079 Viral wart, unspecified: Secondary | ICD-10-CM

## 2024-03-20 NOTE — Assessment & Plan Note (Signed)
 Essentially resolved, we did 1 single additional session of cryotherapy, return as needed.

## 2024-03-20 NOTE — Progress Notes (Signed)
    Procedures performed today:    Procedure:  Cryodestruction of #2 warts on the right hand. Consent obtained and verified. Time-out conducted. Noted no overlying erythema, induration, or other signs of local infection. Completed without difficulty using Cryo-Gun. Advised to call if fevers/chills, erythema, induration, drainage, or persistent bleeding.  Independent interpretation of notes and tests performed by another provider:   None.  Brief History, Exam, Impression, and Recommendations:    Wart of hand Essentially resolved, we did 1 single additional session of cryotherapy, return as needed.    ____________________________________________ Debby PARAS. Curtis, M.D., ABFM., CAQSM., AME. Primary Care and Sports Medicine [facility]  Adjunct Professor of [facility] Medicine  [employer]  Restaurant manager, fast food

## 2024-04-01 ENCOUNTER — Encounter: Payer: Self-pay | Admitting: Sports Medicine

## 2024-06-17 ENCOUNTER — Ambulatory Visit: Admitting: Urgent Care

## 2024-06-17 VITALS — BP 132/56 | HR 50 | Ht <= 58 in | Wt 129.0 lb

## 2024-06-17 DIAGNOSIS — E782 Mixed hyperlipidemia: Secondary | ICD-10-CM

## 2024-06-17 DIAGNOSIS — E538 Deficiency of other specified B group vitamins: Secondary | ICD-10-CM | POA: Diagnosis not present

## 2024-06-17 DIAGNOSIS — R7303 Prediabetes: Secondary | ICD-10-CM | POA: Diagnosis not present

## 2024-06-17 DIAGNOSIS — I10 Essential (primary) hypertension: Secondary | ICD-10-CM

## 2024-06-17 DIAGNOSIS — R001 Bradycardia, unspecified: Secondary | ICD-10-CM | POA: Insufficient documentation

## 2024-06-17 DIAGNOSIS — R011 Cardiac murmur, unspecified: Secondary | ICD-10-CM | POA: Insufficient documentation

## 2024-06-17 DIAGNOSIS — H0520 Unspecified exophthalmos: Secondary | ICD-10-CM | POA: Insufficient documentation

## 2024-06-17 LAB — POCT GLYCOSYLATED HEMOGLOBIN (HGB A1C): HbA1c, POC (controlled diabetic range): 5.9 % (ref 0.0–7.0)

## 2024-06-17 NOTE — Progress Notes (Unsigned)
 Established Patient Office Visit  Subjective:  Patient ID: Tracey Marshall, female    DOB: 08/01/1950  Age: 73 y.o. MRN: [id number]  Chief Complaint  Patient presents with   Transitions Of Care    Transferring care from Dr. Curtis to [place], [place]. - patient declines flu vaccine - history of anaphylaxis reaction in past .     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   CKD (chronic kidney disease) stage 3, GFR 30-59 ml/min (HCC) 12/14/2021   Seborrheic dermatitis of scalp 05/13/2021   Benign positional vertigo 02/20/2021   Midepigastric pain 04/27/2020   Fatty liver disease, nonalcoholic 04/13/2020   Primary osteoarthritis of first carpometacarpal joint of left hand 03/08/2020   Emphysema lung (HCC) 12/12/2019   DDD (degenerative disc disease), cervical 10/24/2019   Cyst of right Bartholin's gland 01/14/2019   Allergic conjunctivitis 10/18/2018   Right elbow pain 09/27/2018   Carpal tunnel syndrome 08/30/2017   Plantar fasciitis, right 08/30/2017   Malignant neoplasm of left female breast (HCC) 03/13/2016   Osteoporosis 01/13/2016   Type 2 diabetes mellitus with obesity 06/17/2015   Annual physical exam 06/10/2015   Hyperlipidemia 06/10/2015   Essential hypertension, benign 06/10/2015   Coronary artery disease post quadruple bypass 06/10/2015   HTN (hypertension) 08/21/2013   Past Medical History:  Diagnosis Date   Allergy    Annual physical exam 06/10/2015   Annual physical exam 06/10/2015   Arthritis    CHF (congestive heart failure) (HCC)    CKD (chronic kidney disease) stage 3, GFR 30-59 ml/min (HCC) 12/14/2021   Emphysema lung (HCC) 12/12/2019   Emphysema of lung (HCC)    Hyperlipidemia     Hypertension    Past Surgical History:  Procedure Laterality Date   BREAST SURGERY     CESAREAN SECTION     CORONARY ARTERY BYPASS GRAFT     Social History   Tobacco Use   Smoking status: Former    Current packs/day: 0.00    Average packs/day: 1 pack/day for 14.0 years (14.0 ttl pk-yrs)    Types: Cigarettes    Start date: 01/05/1993    Quit date: 01/06/2007    Years since quitting: 17.4   Smokeless tobacco: Never   Tobacco comments:    Best way to quit is cold turkey.  Vaping Use   Vaping status: Never Used  Substance Use Topics   Alcohol use: No   Drug use: No      ROS: as noted in HPI  Objective:     BP (!) 132/56   Pulse (!) 50   Ht 4' 8 (1.422 m)   Wt 129 lb (58.5 kg)   SpO2 96%   BMI 28.92 kg/m  BP Readings from Last 3 Encounters:  06/17/24 (!) 132/56  01/24/24 (!) 145/72  01/11/24 (!) 143/64   Wt Readings from Last 3 Encounters:  06/17/24 129 lb (58.5 kg)  01/24/24 127 lb (57.6 kg)  11/09/23 126 lb (57.2 kg)      Physical Exam   Results for orders placed or performed in visit on 06/17/24  POCT HgB A1C  Result Value Ref Range   Hemoglobin A1C     HbA1c POC (<> result, manual entry)  HbA1c, POC (prediabetic range)     HbA1c, POC (controlled diabetic range) 5.9 0.0 - 7.0 %    Last CBC Lab Results  Component Value Date   WBC 7.8 01/24/2024   HGB 12.3 01/24/2024   HCT 39.4 01/24/2024   MCV 91 01/24/2024   MCH 28.3 01/24/2024   RDW 13.0 01/24/2024   PLT 253 01/24/2024   Last metabolic panel Lab Results  Component Value Date   GLUCOSE 83 01/24/2024   NA 139 01/24/2024   K 4.3 01/24/2024   CL 108 (H) 01/24/2024   CO2 23 01/24/2024   BUN 12 01/24/2024   CREATININE 1.03 (H) 01/24/2024   EGFR 57 (L) 01/24/2024   CALCIUM  9.7 01/24/2024   PROT 6.6 01/24/2024   ALBUMIN 4.2 01/24/2024   LABGLOB 2.4 01/24/2024   BILITOT 0.3 01/24/2024   ALKPHOS 65 01/24/2024   AST 34 01/24/2024   ALT 27 01/24/2024   Last lipids Lab Results   Component Value Date   CHOL 105 01/24/2024   HDL 51 01/24/2024   LDLCALC 37 01/24/2024   TRIG 90 01/24/2024   CHOLHDL 2.1 01/24/2024   Last hemoglobin A1c Lab Results  Component Value Date   HGBA1C 5.9 06/17/2024   Last thyroid  functions Lab Results  Component Value Date   TSH 2.540 01/24/2024   Last vitamin D  Lab Results  Component Value Date   VD25OH 94.5 01/24/2024   Last vitamin B12 and Folate Lab Results  Component Value Date   VITAMINB12 >2000 (H) 01/24/2024   FOLATE 17.1 09/19/2023      The ASCVD Risk score (Arnett DK, et al., 2019) failed to calculate for the following reasons:   The valid total cholesterol range is 130 to 320 mg/dL  Assessment & Plan:  Essential hypertension, benign  B12 deficiency  Diabetic nephropathy associated with type 2 diabetes mellitus (HCC) -     POCT glycosylated hemoglobin (Hb A1C)     No follow-ups on file.   Benton LITTIE Gave, PA

## 2024-06-18 ENCOUNTER — Encounter: Payer: Self-pay | Admitting: Urgent Care

## 2024-07-16 ENCOUNTER — Ambulatory Visit

## 2024-08-05 ENCOUNTER — Ambulatory Visit

## 2024-08-26 ENCOUNTER — Ambulatory Visit

## 2024-09-09 ENCOUNTER — Ambulatory Visit

## 2024-10-15 ENCOUNTER — Ambulatory Visit: Admitting: Urgent Care

## 2025-01-12 ENCOUNTER — Ambulatory Visit: Admitting: Physician Assistant
